# Patient Record
Sex: Male | Born: 1950 | Race: White | Hispanic: No | Marital: Married | Smoking: Former smoker
Health system: Southern US, Community
[De-identification: ages and names within clinical notes are randomized; demographics above are authoritative.]

## PROBLEM LIST (undated history)

## (undated) DIAGNOSIS — I10 Essential (primary) hypertension: Secondary | ICD-10-CM

## (undated) DIAGNOSIS — I493 Ventricular premature depolarization: Secondary | ICD-10-CM

## (undated) DIAGNOSIS — G4730 Sleep apnea, unspecified: Secondary | ICD-10-CM

## (undated) DIAGNOSIS — E785 Hyperlipidemia, unspecified: Secondary | ICD-10-CM

## (undated) DIAGNOSIS — I4891 Unspecified atrial fibrillation: Secondary | ICD-10-CM

## (undated) HISTORY — PX: ATRIAL ABLATION SURGERY: SHX560

## (undated) HISTORY — DX: Sleep apnea, unspecified: G47.30

## (undated) HISTORY — DX: Unspecified atrial fibrillation: I48.91

## (undated) HISTORY — DX: Hyperlipidemia, unspecified: E78.5

## (undated) HISTORY — PX: TONSILLECTOMY AND ADENOIDECTOMY: SUR1326

## (undated) HISTORY — DX: Ventricular premature depolarization: I49.3

## (undated) HISTORY — DX: Essential (primary) hypertension: I10

## (undated) HISTORY — PX: FLEXIBLE SIGMOIDOSCOPY: SHX1649

---

## 2004-05-17 ENCOUNTER — Inpatient Hospital Stay (HOSPITAL_COMMUNITY): Admission: EM | Admit: 2004-05-17 | Discharge: 2004-05-20 | Payer: Self-pay | Admitting: Emergency Medicine

## 2004-05-18 ENCOUNTER — Encounter (INDEPENDENT_AMBULATORY_CARE_PROVIDER_SITE_OTHER): Payer: Self-pay | Admitting: *Deleted

## 2004-10-12 ENCOUNTER — Ambulatory Visit: Payer: Self-pay | Admitting: Internal Medicine

## 2005-03-08 ENCOUNTER — Ambulatory Visit: Payer: Self-pay | Admitting: Internal Medicine

## 2005-06-27 ENCOUNTER — Ambulatory Visit: Payer: Self-pay | Admitting: Internal Medicine

## 2005-07-29 ENCOUNTER — Ambulatory Visit: Payer: Self-pay | Admitting: Family Medicine

## 2005-11-21 ENCOUNTER — Ambulatory Visit: Payer: Self-pay | Admitting: Internal Medicine

## 2006-09-04 ENCOUNTER — Encounter: Payer: Self-pay | Admitting: Internal Medicine

## 2006-09-21 ENCOUNTER — Encounter: Payer: Self-pay | Admitting: Internal Medicine

## 2006-12-01 ENCOUNTER — Telehealth (INDEPENDENT_AMBULATORY_CARE_PROVIDER_SITE_OTHER): Payer: Self-pay | Admitting: *Deleted

## 2006-12-01 ENCOUNTER — Emergency Department (HOSPITAL_COMMUNITY): Admission: EM | Admit: 2006-12-01 | Discharge: 2006-12-01 | Payer: Self-pay | Admitting: Emergency Medicine

## 2007-04-10 ENCOUNTER — Ambulatory Visit: Payer: Self-pay | Admitting: Cardiology

## 2007-04-11 ENCOUNTER — Inpatient Hospital Stay (HOSPITAL_COMMUNITY): Admission: EM | Admit: 2007-04-11 | Discharge: 2007-04-13 | Payer: Self-pay | Admitting: Emergency Medicine

## 2007-04-12 ENCOUNTER — Encounter (INDEPENDENT_AMBULATORY_CARE_PROVIDER_SITE_OTHER): Payer: Self-pay | Admitting: Cardiology

## 2007-04-17 ENCOUNTER — Telehealth (INDEPENDENT_AMBULATORY_CARE_PROVIDER_SITE_OTHER): Payer: Self-pay | Admitting: *Deleted

## 2007-05-02 ENCOUNTER — Ambulatory Visit: Payer: Self-pay | Admitting: Internal Medicine

## 2007-05-02 DIAGNOSIS — M545 Low back pain: Secondary | ICD-10-CM

## 2007-05-02 DIAGNOSIS — I4891 Unspecified atrial fibrillation: Secondary | ICD-10-CM | POA: Insufficient documentation

## 2007-05-02 DIAGNOSIS — I4892 Unspecified atrial flutter: Secondary | ICD-10-CM

## 2007-05-02 DIAGNOSIS — E785 Hyperlipidemia, unspecified: Secondary | ICD-10-CM

## 2007-05-02 DIAGNOSIS — R209 Unspecified disturbances of skin sensation: Secondary | ICD-10-CM

## 2007-05-11 ENCOUNTER — Encounter (INDEPENDENT_AMBULATORY_CARE_PROVIDER_SITE_OTHER): Payer: Self-pay | Admitting: *Deleted

## 2007-07-02 ENCOUNTER — Encounter: Payer: Self-pay | Admitting: Internal Medicine

## 2007-08-13 ENCOUNTER — Encounter: Payer: Self-pay | Admitting: Internal Medicine

## 2007-09-10 ENCOUNTER — Encounter: Payer: Self-pay | Admitting: Internal Medicine

## 2007-09-17 ENCOUNTER — Ambulatory Visit: Payer: Self-pay | Admitting: Internal Medicine

## 2007-09-17 DIAGNOSIS — N400 Enlarged prostate without lower urinary tract symptoms: Secondary | ICD-10-CM | POA: Insufficient documentation

## 2007-09-17 DIAGNOSIS — G5600 Carpal tunnel syndrome, unspecified upper limb: Secondary | ICD-10-CM

## 2007-09-17 DIAGNOSIS — L299 Pruritus, unspecified: Secondary | ICD-10-CM | POA: Insufficient documentation

## 2007-09-18 ENCOUNTER — Encounter (INDEPENDENT_AMBULATORY_CARE_PROVIDER_SITE_OTHER): Payer: Self-pay | Admitting: *Deleted

## 2007-09-24 ENCOUNTER — Ambulatory Visit: Payer: Self-pay | Admitting: Cardiology

## 2007-10-01 ENCOUNTER — Telehealth (INDEPENDENT_AMBULATORY_CARE_PROVIDER_SITE_OTHER): Payer: Self-pay | Admitting: *Deleted

## 2008-02-13 ENCOUNTER — Ambulatory Visit: Payer: Self-pay | Admitting: Internal Medicine

## 2008-03-05 ENCOUNTER — Inpatient Hospital Stay (HOSPITAL_COMMUNITY): Admission: EM | Admit: 2008-03-05 | Discharge: 2008-03-08 | Payer: Self-pay | Admitting: Emergency Medicine

## 2008-03-05 ENCOUNTER — Ambulatory Visit: Payer: Self-pay | Admitting: Internal Medicine

## 2008-03-14 ENCOUNTER — Ambulatory Visit: Payer: Self-pay | Admitting: Internal Medicine

## 2008-09-18 ENCOUNTER — Telehealth: Payer: Self-pay | Admitting: Family Medicine

## 2009-01-29 ENCOUNTER — Encounter (INDEPENDENT_AMBULATORY_CARE_PROVIDER_SITE_OTHER): Payer: Self-pay | Admitting: *Deleted

## 2009-02-23 IMAGING — CR DG CHEST 1V PORT
1 series · 1 of 1 positions shown · non-contrast
Comparison: 12/01/06.

CLINICAL DATA: Shortness of breath.  Cardiac arrhythmia.  
 PORTABLE CHEST:

[AP]
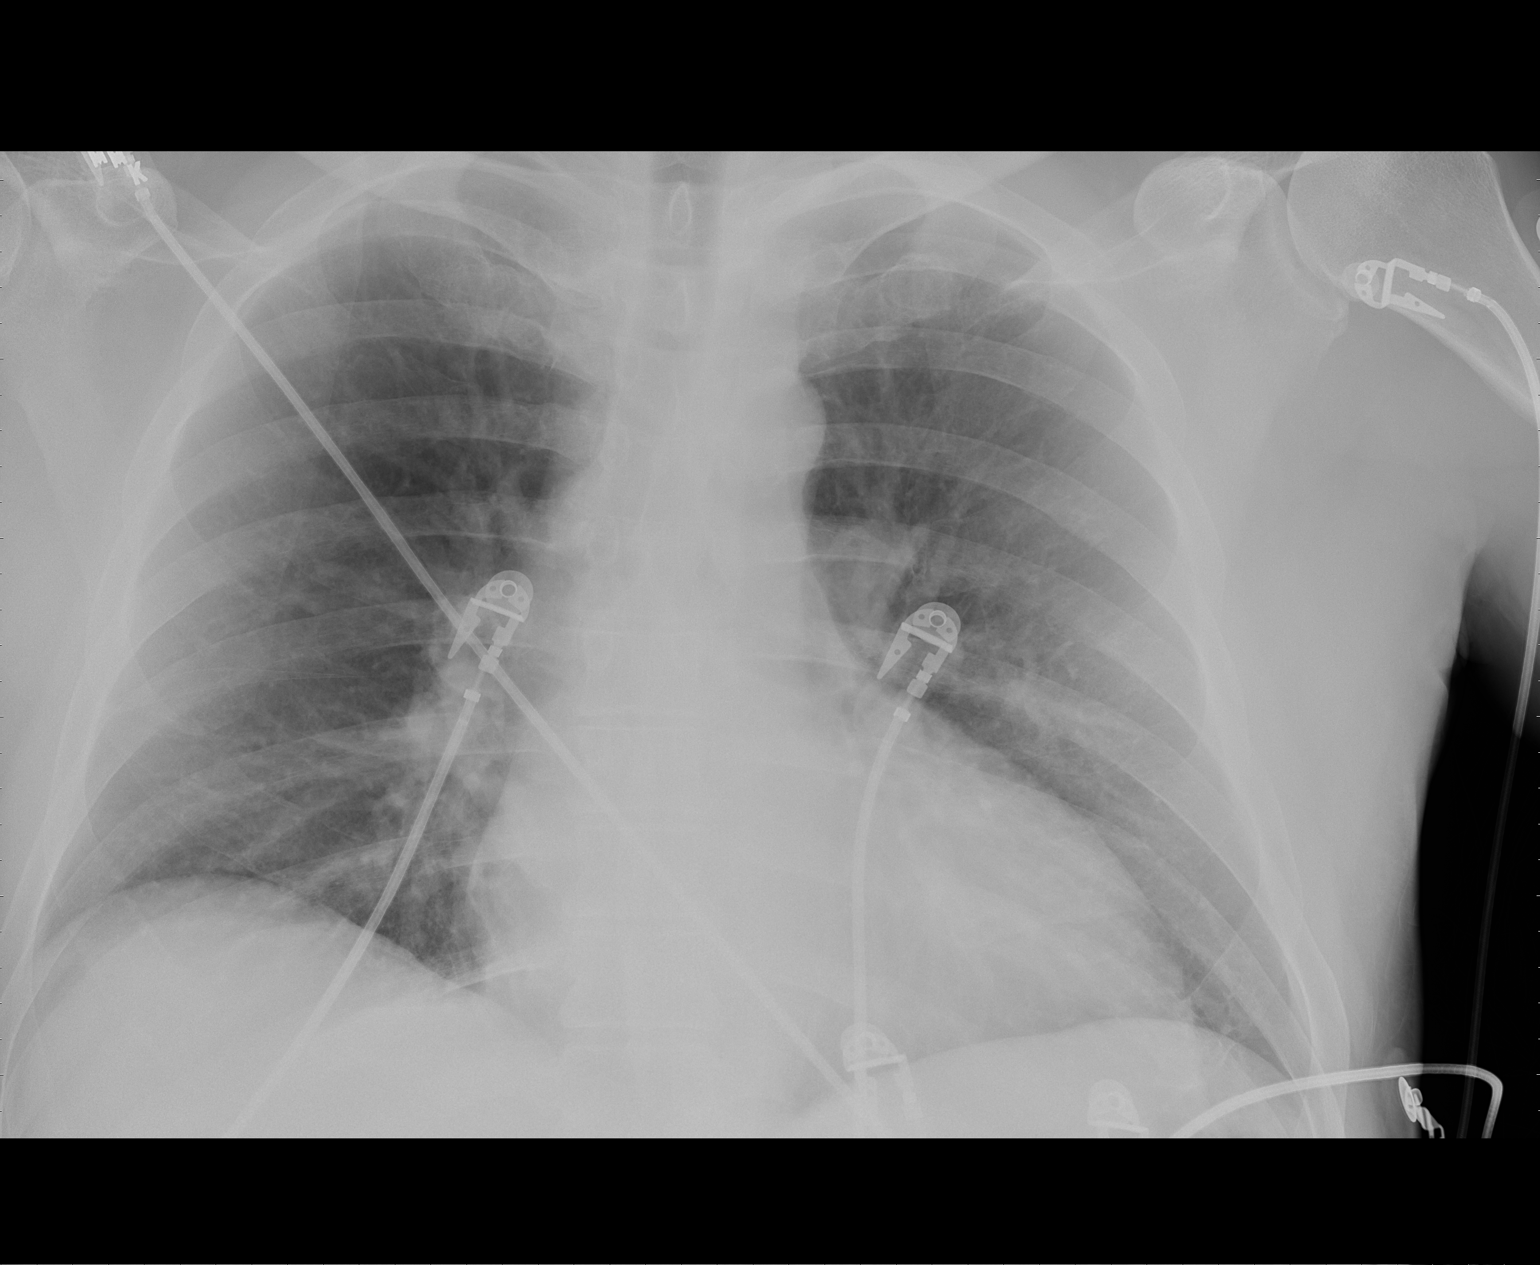

[1 of 1 positions shown; findings below may reference images not displayed]

FINDINGS: Low lung volumes are seen.  Heart size is probably within normal limits allowing for low lung volumes.  Both lungs are clear.
IMPRESSION: Low inspiratory lung volumes.  No acute findings.

## 2009-03-06 ENCOUNTER — Ambulatory Visit: Payer: Self-pay | Admitting: Family Medicine

## 2009-04-09 ENCOUNTER — Ambulatory Visit: Payer: Self-pay | Admitting: Internal Medicine

## 2009-04-09 DIAGNOSIS — I10 Essential (primary) hypertension: Secondary | ICD-10-CM

## 2009-11-23 ENCOUNTER — Telehealth (INDEPENDENT_AMBULATORY_CARE_PROVIDER_SITE_OTHER): Payer: Self-pay | Admitting: *Deleted

## 2010-01-19 IMAGING — CR DG CHEST 1V PORT
1 series · 1 of 1 positions shown · non-contrast
Comparison: Portable exam 5201 hours compared to 04/10/2007

CLINICAL DATA: Arrhythmia, chest pain, shortness of breath

PORTABLE CHEST - 1 VIEW

[view not recorded]
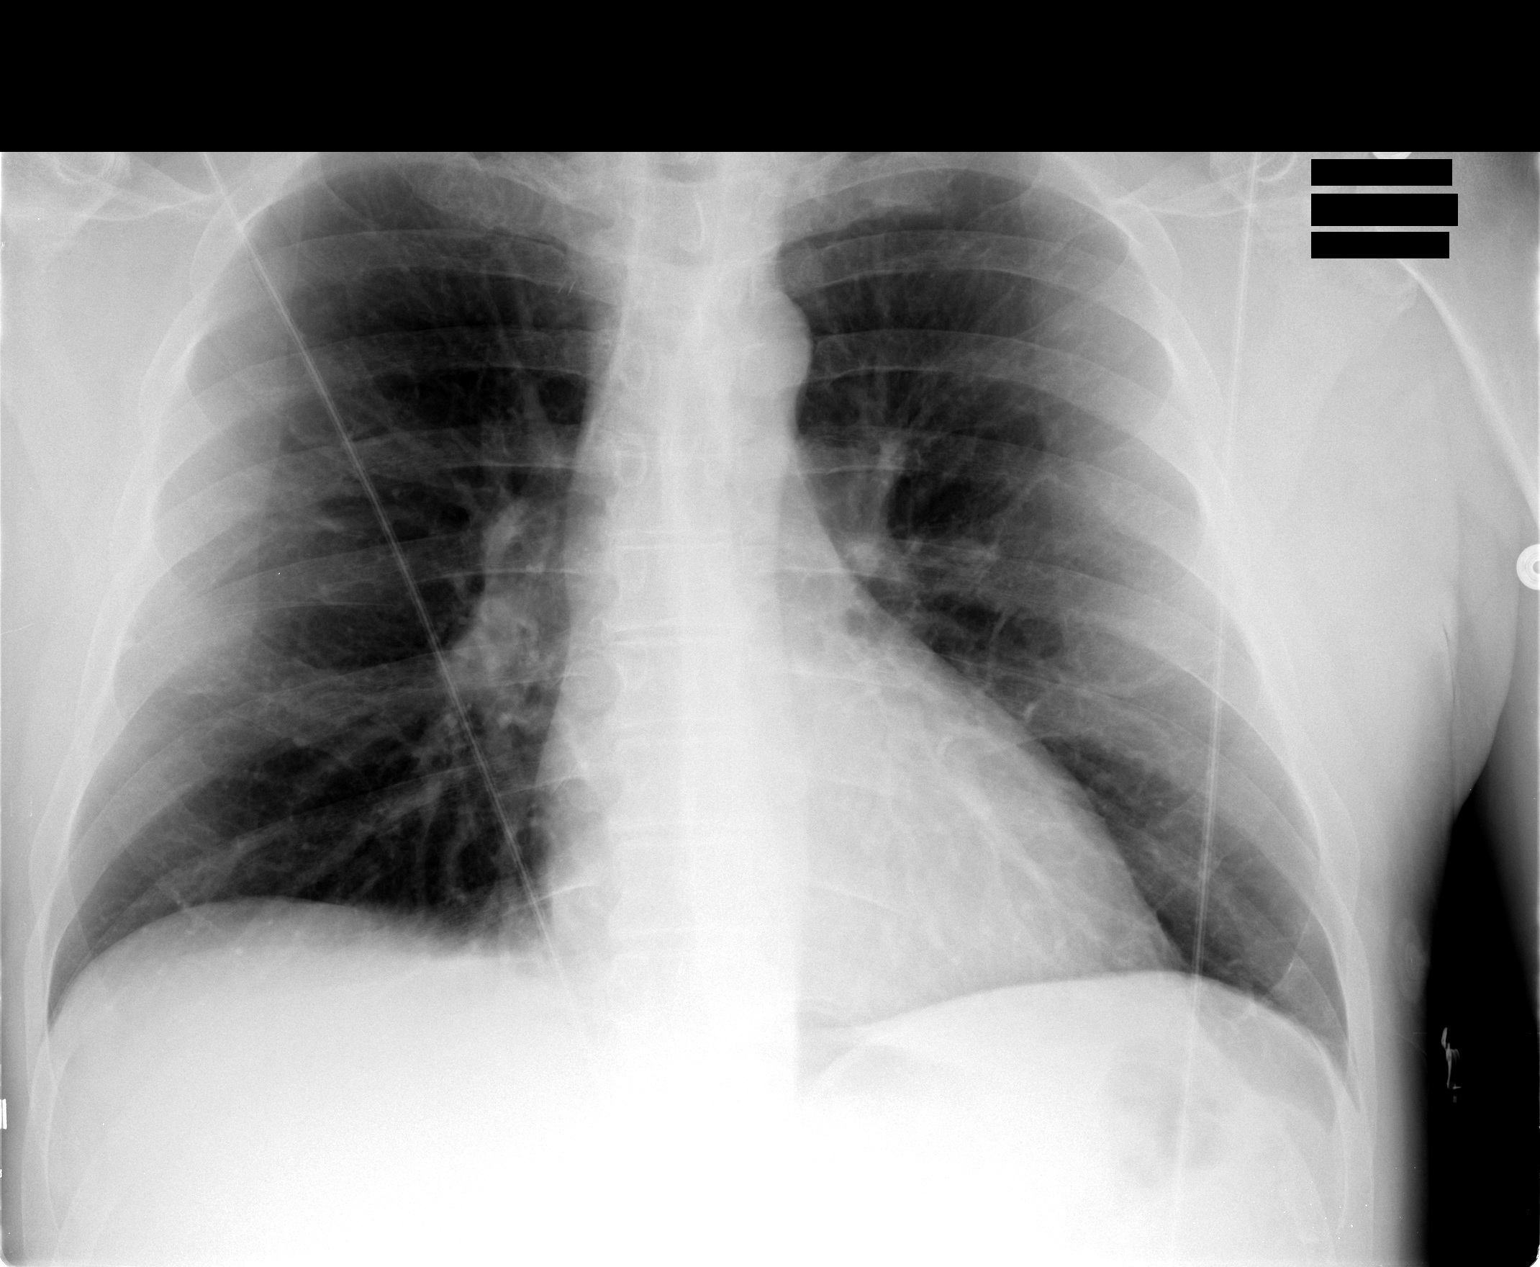

[1 of 1 positions shown; findings below may reference images not displayed]

FINDINGS: Normal heart size, mediastinal contours, and pulmonary vascularity.
Lungs clear.
No pleural effusion or pneumothorax.
IMPRESSION: No acute abnormalities.

## 2010-02-03 ENCOUNTER — Encounter: Payer: Self-pay | Admitting: Internal Medicine

## 2010-02-03 ENCOUNTER — Ambulatory Visit: Payer: Self-pay | Admitting: Internal Medicine

## 2010-02-08 LAB — CONVERTED CEMR LAB
ALT: 38 units/L (ref 0–53)
AST: 24 units/L (ref 0–37)
Basophils Relative: 1.1 % (ref 0.0–3.0)
Eosinophils Relative: 3.4 % (ref 0.0–5.0)
GFR calc non Af Amer: 116.14 mL/min (ref >60)
HCT: 46.8 % (ref 39.0–52.0)
HDL: 56 mg/dL (ref >39.00)
Hemoglobin: 16.1 g/dL (ref 13.0–17.0)
Lymphs Abs: 1.8 10*3/uL (ref 0.7–4.0)
Monocytes Relative: 6.7 % (ref 3.0–12.0)
Neutro Abs: 4.4 10*3/uL (ref 1.4–7.7)
Platelets: 247 10*3/uL (ref 150.0–400.0)
Potassium: 4.5 meq/L (ref 3.5–5.1)
RBC: 5.19 M/uL (ref 4.22–5.81)
Sodium: 143 meq/L (ref 135–145)
TSH: 0.76 microintl units/mL (ref 0.35–5.50)
Total Bilirubin: 0.8 mg/dL (ref 0.3–1.2)
Total CHOL/HDL Ratio: 4
Total Protein: 6.9 g/dL (ref 6.0–8.3)
VLDL: 36.2 mg/dL (ref 0.0–40.0)
WBC: 7 10*3/uL (ref 4.5–10.5)

## 2010-04-25 LAB — CONVERTED CEMR LAB
ALT: 48 units/L (ref 0–53)
AST: 23 units/L (ref 0–37)
Basophils Absolute: 0.1 10*3/uL (ref 0.0–0.1)
Basophils Relative: 1.2 % — ABNORMAL HIGH (ref 0.0–1.0)
Bilirubin, Direct: 0.1 mg/dL (ref 0.0–0.3)
CO2: 30 meq/L (ref 19–32)
Chloride: 106 meq/L (ref 96–112)
Glucose, Bld: 87 mg/dL (ref 70–99)
Hemoglobin: 16.9 g/dL (ref 13.0–17.0)
Lymphocytes Relative: 23.9 % (ref 12.0–46.0)
MCHC: 35.2 g/dL (ref 30.0–36.0)
Monocytes Relative: 6.5 % (ref 3.0–12.0)
Neutrophils Relative %: 66.2 % (ref 43.0–77.0)
RBC: 5.29 M/uL (ref 4.22–5.81)
Sodium: 142 meq/L (ref 135–145)
Total Bilirubin: 0.9 mg/dL (ref 0.3–1.2)
Total Protein: 7.2 g/dL (ref 6.0–8.3)

## 2010-04-27 NOTE — Assessment & Plan Note (Signed)
Summary: cpx - cbs   Vital Signs:  Patient profile:   60 year old male Height:      73.25 inches Weight:      222.6 pounds BMI:     29.27 Temp:     98.2 degrees F oral Pulse rate:   56 / minute Resp:     16 per minute BP sitting:   148 / 86  (left arm) Cuff size:   large  Vitals Entered By: Shonna Chock CMA (February 03, 2010 9:49 AM) CC: CPX with fasting labs , Fatigue   Primary Care Provider:  Alwyn Ren  CC:  CPX with fasting labs  and Fatigue.  History of Present Illness:       Arthur Escobar is here for a physical; he has had fatigue by mid afternoon for almost a year. The patient reports fatigue without physical activity.  The patient denies fever, night sweats, weight loss, exertional chest pain, dyspnea, cough, hemoptysis, and new medications.  Other symptoms include  intermittent severe snoring and daytime sleepiness.  No apnea reported to date. OSA test was" borderline " in 2009. The patient denies the following symptoms: leg swelling, orthopnea, PND, melena, adenopathy, and skin changes.  The patient denies altered appetite and poor sleep.   Hyperlipidemia Follow-Up       The patient denies muscle aches, abdominal pain, flushing, itching, constipation, and diarrhea.  The patient denies the following symptoms: syncope.  Compliance with medications (by patient report) has been near 100%.  Dietary compliance has been excellent.  The patient reports no exercise.  Adjunctive measures currently used by the patient include ASA.    Current Medications (verified): 1)  Flecainide Acetate 100 Mg  Tabs (Flecainide Acetate) .Marland Kitchen.. 1 By Mouth Two Times A Day 2)  Paroxetine Hcl 20 Mg  Tabs (Paroxetine Hcl) .Marland Kitchen.. 1 Once Daily 3)  Crestor 20 Mg Tabs (Rosuvastatin Calcium) .Marland Kitchen.. 1 By Mouth At Bedtime 4)  Bayer Aspirin 325 Mg Tabs (Aspirin) .Marland Kitchen.. 1 By Mouth Once Daily  Allergies (verified): No Known Drug Allergies  Past History:  Past Medical History:  1. Paroxysmal atrial fibrillation , Dr Amil Amen  2.  Isthmus dependent atrial flutter status post cavo-tricuspid isthmus ablation March 07, 2008.   3. 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    4. Obstructive sleep apnea, "borderline"  in 2009.   5. Patent foramen ovale.   6. Hyperlipidemia.   7. Ejection fraction 50-60% by echocardiogram in January 2009.   Past Surgical History: Cardioversion 03/2007 ; Flexible sigmoidoscopy 2004 Tonsillectomy  Family History: Father: HTN,MI in 80s,CBAG Mother: CHF,pacer Siblings: negative  Social History: The patient denies tobacco or ethanol abuse.  Occupation:Project Manager Married  Review of Systems       The patient complains of decreased hearing.  The patient denies vision loss, hoarseness, headaches, suspicious skin lesions, depression, unusual weight change, abnormal bleeding, and angioedema.    Physical Exam  General:  well-nourished;alert,appropriate and cooperative throughout examination Head:  Normocephalic and atraumatic without obvious abnormalities. pattern  alopecia  Eyes:  No corneal or conjunctival inflammation noted. EOMI. Perrla. Funduscopic exam benign, without hemorrhages, exudates or papilledema. Vision grossly normal. Ears:  External ear exam shows no significant lesions or deformities.  Otoscopic examination reveals clear canals, tympanic membranes are intact bilaterally without bulging, retraction, inflammation or discharge. Hearing is grossly normal bilaterally. Nose:  External nasal examination shows no deformity or inflammation. Nasal mucosa are pink and moist without lesions or  exudates. Septal dislocation Mouth:  Oral mucosa and oropharynx without lesions or exudates.  Teeth in good repair. Lungs:  Normal respiratory effort, chest expands symmetrically. Lungs are clear to auscultation, no crackles or wheezes. Heart:  regular rhythm, no murmur, no gallop, no rub, no JVD, no HJR, and bradycardia.   Abdomen:  Bowel sounds  positive,abdomen soft and non-tender without masses, organomegaly or hernias noted. Rectal:  No external abnormalities noted. Normal sphincter tone. No rectal masses or tenderness. Genitalia:  Testes bilaterally descended without nodularity, tenderness or masses. No scrotal masses or lesions. No penis lesions or urethral discharge. Prostate:  Prostate  only partially examined; no definite abnormaliities Msk:  No deformity or scoliosis noted of thoracic or lumbar spine.   Pulses:  R and L carotid,radial,dorsalis pedis and posterior tibial pulses are full and equal bilaterally Extremities:  No clubbing, cyanosis, edema, or deformity noted with normal full range of motion of all joints.   Neurologic:  alert & oriented X3 and DTRs symmetrical and normal.   Skin:  Intact without suspicious lesions or rashes. Lipomata of abdomen Cervical Nodes:  No lymphadenopathy noted Axillary Nodes:  No palpable lymphadenopathy Inguinal Nodes:  No significant adenopathy Psych:  memory intact for recent and remote, normally interactive, and good eye contact.     Impression & Recommendations:  Problem # 1:  ROUTINE GENERAL MEDICAL [email or web address]  CARE FACL (ICD-V70.0)  Orders: EKG w/ Interpretation (93000) Venipuncture (44315) TLB-Lipid Panel (80061-LIPID) TLB-BMP (Basic Metabolic Panel-BMET) (80048-METABOL) TLB-CBC Platelet - w/Differential (85025-CBCD) TLB-Hepatic/Liver Function Pnl (80076-HEPATIC) TLB-TSH (Thyroid Stimulating Hormone) (84443-TSH) TLB-T4 (Thyrox), Free (40086-PY1P) TLB-PSA (Prostate Specific Antigen) (84153-PSA)  Problem # 2:  FATIGUE (ICD-780.79) PMH of "borderline" OSA  Problem # 3:  ATRIAL FIBRILLATION (ICD-427.31) PAF , PMH of . EKG: NSR. As per Dr Amil Amen His updated medication list for this problem includes:    Flecainide Acetate 100 Mg Tabs (Flecainide acetate) .Marland Kitchen... 1 by mouth two times a day    Bayer Aspirin 325 Mg Tabs (Aspirin) .Marland Kitchen... 1 by mouth once daily  Problem # 4:   HYPERLIPIDEMIA (ICD-272.4)  His updated medication list for this problem includes:    Crestor 20 Mg Tabs (Rosuvastatin calcium) .Marland Kitchen... 1 by mouth at bedtime  Problem # 5:  SPECIAL SCREENING MALIGNANT NEOPLASM OF PROSTATE (ICD-V76.44)  Complete Medication List: 1)  Flecainide Acetate 100 Mg Tabs (Flecainide acetate) .Marland Kitchen.. 1 by mouth two times a day 2)  Paroxetine Hcl 20 Mg Tabs (Paroxetine hcl) .Marland Kitchen.. 1 once daily 3)  Crestor 20 Mg Tabs (Rosuvastatin calcium) .Marland Kitchen.. 1 by mouth at bedtime 4)  Bayer Aspirin 325 Mg Tabs (Aspirin) .Marland Kitchen.. 1 by mouth once daily  Patient Instructions: 1)  Sleep referral if labs are negative. 2)  Schedule a colonoscopy to help detect colon cancer asdiscussed.   Orders Added: 1)  Est. Patient 40-64 years [99396] 2)  EKG w/ Interpretation [93000] 3)  Venipuncture [36415] 4)  TLB-Lipid Panel [80061-LIPID] 5)  TLB-BMP (Basic Metabolic Panel-BMET) [80048-METABOL] 6)  TLB-CBC Platelet - w/Differential [85025-CBCD] 7)  TLB-Hepatic/Liver Function Pnl [80076-HEPATIC] 8)  TLB-TSH (Thyroid Stimulating Hormone) [84443-TSH] 9)  TLB-T4 (Thyrox), Free [50932-IZ1I] 10)  TLB-PSA (Prostate Specific Antigen) [45809-XIP]     Appended Document: cpx - cbs Flu Vaccine Consent Questions     Do you have a history of severe allergic reactions to this vaccine? no    Any prior history of allergic reactions to egg and/or gelatin? no    Do you have a sensitivity to the preservative Thimersol? no  Do you have a past history of Guillan-Barre Syndrome? no    Do you currently have an acute febrile illness? no    Have you ever had a severe reaction to latex? no    Vaccine information given and explained to patient? yes    Are you currently pregnant? no    Lot Number:[id number]   Exp Date:09/25/2010   Site Given  Right Deltoid IM    Appended Document: cpx - cbs

## 2010-04-27 NOTE — Assessment & Plan Note (Signed)
Summary: COUGHING, TROUBLE CATCHING BREATH, NO FEVER, ACHING, HEADACHE...   Vital Signs:  Patient profile:   60 year old male Weight:      218.4 pounds O2 Sat:      97 % Temp:     98.9 degrees F oral Pulse rate:   75 / minute Resp:     16 per minute BP sitting:   150 / 90  (left arm) Cuff size:   large  Vitals Entered By: Shonna Chock (April 09, 2009 4:44 PM)  Primary Care Provider:  Alwyn Ren   History of Present Illness: Onset 04/06/2009 as joint pain , myalgias & headaches over crown. NP cough as of 01/12.Rx: Theraflu . See BP; he denies decongestant use. No PMH of asthma ; remote smoking hx.See ROS for active symptoms.Last OV for sinusitis reviewed ; some cough syrup left.  Allergies (verified): No Known Drug Allergies  Review of Systems General:  Complains of sweats; denies chills and fever. ENT:  Denies nasal congestion and sinus pressure; No frontal headache, facial pain or purulence. Resp:  Complains of cough; denies chest pain with inspiration, shortness of breath, sputum productive, and wheezing.  Physical Exam  General:  in no acute distress; alert,appropriate and cooperative throughout examination Ears:  External ear exam shows no significant lesions or deformities.  Otoscopic examination reveals clear canals, tympanic membranes are intact bilaterally without bulging, retraction, inflammation or discharge. Hearing is grossly normal bilaterally. Nose:  External nasal examination shows no deformity or inflammation. Nasal mucosa are pink and moist without lesions or exudates. Septum dislocated  Mouth:  Oral mucosa and oropharynx without lesions or exudates.  Teeth in good repair. Lungs:  Normal respiratory effort, chest expands symmetrically. Lungs are clear to auscultation, no crackles or wheezes. Dry cough Cervical Nodes:  No lymphadenopathy noted Axillary Nodes:  No palpable lymphadenopathy   Impression & Recommendations:  Problem # 1:  BRONCHITIS-ACUTE  (ICD-466.0)  The following medications were removed from the medication list:    Cheratussin Ac 100-10 Mg/33ml Syrp (Guaifenesin-codeine) .Marland Kitchen... 1-2 tsp by mouth at bedtime as needed His updated medication list for this problem includes:    Doxycycline Hyclate 100 Mg Caps (Doxycycline hyclate) .Marland Kitchen... 1 two times a day x 5 days then 1 once daily  Problem # 2:  ELEVATED BLOOD PRESSURE WITHOUT DIAGNOSIS OF HYPERTENSION (ICD-796.2)  Complete Medication List: 1)  Flecainide Acetate 100 Mg Tabs (Flecainide acetate) .Marland Kitchen.. 1 by mouth two times a day 2)  Paroxetine Hcl 20 Mg Tabs (Paroxetine hcl) .Marland Kitchen.. 1 once daily 3)  Crestor 10 Mg Tabs (Rosuvastatin calcium) .Marland Kitchen.. 1 by mouth daily 4)  Doxycycline Hyclate 100 Mg Caps (Doxycycline hyclate) .Marland Kitchen.. 1 two times a day x 5 days then 1 once daily  Patient Instructions: 1)  Consume LESS THAN 40 grams of sugar/day from LABELED foods & drinks with High Fructose Corn Syrup as #1,2 or #3 . 2)  Drink as much fluid as you can tolerate for the next few days. 3)  Check your Blood Pressure regularly. If it is above: 135/85 ON AVERAGE  you should make an appointment. Prescriptions: DOXYCYCLINE HYCLATE 100 MG CAPS (DOXYCYCLINE HYCLATE) 1 two times a day X 5 days then 1 once daily  #15 x 0   Entered and Authorized by:   Marga Melnick MD   Signed by:   Marga Melnick MD on 04/09/2009   Method used:   Faxed to ...       [facility]  [address] [phone]* (retail)  [address]       [place], [place]  [address]       Ph: [phone]       Fax: [phone]   RxID:   [id number]

## 2010-04-27 NOTE — Progress Notes (Signed)
Summary: CALLED TO SAY HE DOESNT KNOW CRESTOR MG'S(LMOM 8/30)  Phone Note Call from Patient Call back at [facility] Phone [phone]   Caller: Patient Summary of Call: PATIENT CALLED TO REPORT THAT HE DOES NOT KNOW WHETHER HE TAKES CRESTOR 10 MG OR CRESTOR 20 MG (HE JUSTS OPENS THE BOTTLE AND TAKES THE PILL---DOESNT KNOW STRENGTH)  NEEDS REFILL   Initial call taken by: Jerolyn Shin,  November 23, 2009 11:47 AM  Follow-up for Phone Call        I called patient and left a voice mail informing him that I need to know the dose of his med to refill, I informed patient if he would look at his med bottle and clarify what the dose is. Follow-up by: Shonna Chock CMA,  November 23, 2009 12:05 PM  Additional Follow-up for Phone Call Additional follow up Details #1::        Left message on machine for patient to return call when avaliable, Reason for call:   Discuss dose of Crestor, stressed the importance of [place] knowing exactly what dose he is taking, requested patient call with this info Additional Follow-up by: Shonna Chock CMA,  November 24, 2009 9:43 AM    Additional Follow-up for Phone Call Additional follow up Details #2::    I spoke with patient and had him look at his bottle to verify Crestor dose, patient taking 20mg  tab once daily Follow-up by: Shonna Chock CMA,  November 24, 2009 4:44 PM  New/Updated Medications: CRESTOR 20 MG TABS (ROSUVASTATIN CALCIUM) 1 by mouth at bedtime Prescriptions: CRESTOR 20 MG TABS (ROSUVASTATIN CALCIUM) 1 by mouth at bedtime  #90 x 0   Entered by:   Shonna Chock CMA   Authorized by:   Marga Melnick MD   Signed by:   Shonna Chock CMA on 11/24/2009   Method used:   Electronically to        [facility] [phone]* (retail)       [address]       [place], [place]  [address]       Ph: [phone]       Fax: [phone]   RxID:   [id number]

## 2010-05-19 ENCOUNTER — Encounter: Payer: Self-pay | Admitting: Internal Medicine

## 2010-05-19 ENCOUNTER — Ambulatory Visit (INDEPENDENT_AMBULATORY_CARE_PROVIDER_SITE_OTHER): Payer: BC Managed Care – PPO | Admitting: Internal Medicine

## 2010-05-19 ENCOUNTER — Other Ambulatory Visit: Payer: Self-pay | Admitting: Internal Medicine

## 2010-05-19 DIAGNOSIS — R5381 Other malaise: Secondary | ICD-10-CM

## 2010-05-19 DIAGNOSIS — F411 Generalized anxiety disorder: Secondary | ICD-10-CM | POA: Insufficient documentation

## 2010-05-19 DIAGNOSIS — R5383 Other fatigue: Secondary | ICD-10-CM | POA: Insufficient documentation

## 2010-05-19 DIAGNOSIS — I4892 Unspecified atrial flutter: Secondary | ICD-10-CM

## 2010-05-19 LAB — CBC WITH DIFFERENTIAL/PLATELET
Basophils Relative: 1.1 % (ref 0.0–3.0)
Eosinophils Relative: 3.3 % (ref 0.0–5.0)
HCT: 47.4 % (ref 39.0–52.0)
Lymphs Abs: 2.1 10*3/uL (ref 0.7–4.0)
MCV: 90.3 fl (ref 78.0–100.0)
Monocytes Absolute: 0.5 10*3/uL (ref 0.1–1.0)
Platelets: 259 10*3/uL (ref 150.0–400.0)
WBC: 8.4 10*3/uL (ref 4.5–10.5)

## 2010-05-19 LAB — POTASSIUM: Potassium: 4.3 mEq/L (ref 3.5–5.1)

## 2010-05-19 LAB — BUN: BUN: 12 mg/dL (ref 6–23)

## 2010-05-19 LAB — TSH: TSH: 0.8 u[IU]/mL (ref 0.35–5.50)

## 2010-05-25 NOTE — Assessment & Plan Note (Signed)
Summary: to go over a script for paxil--ph   Vital Signs:  Patient profile:   60 year old male Weight:      224 pounds BMI:     29.46 Temp:     98.3 degrees F oral Pulse rate:   76 / minute Resp:     14 per minute BP sitting:   130 / 86  (left arm) Cuff size:   large  Vitals Entered By: Shonna Chock CMA (May 19, 2010 1:47 PM) CC: Discuss Paxil, Fatigue   Primary Care Provider:  Alwyn Ren  CC:  Discuss Paxil and Fatigue.  History of Present Illness:    Arthur Escobar feels Paroxetine has been of value , but on 05/17/2010 he had a "panic attack". The night before he had driven back from [facility] in snow & sleet. Also his energy level has decreased recently ,over several months.He reports persistent fatigue and primarily motivational fatigue.  The patient denies fever, night sweats, weight loss, exertional chest pain, dyspnea, cough, and hemoptysis.  Other symptoms include severe snoring and daytime sleepiness. A Sleep Study was borderline several years ago. He was intolerant to CPAP. The patient denies the following symptoms: leg swelling, orthopnea, PND, melena, adenopathy, and skin changes.    Current Medications (verified): 1)  Flecainide Acetate 100 Mg  Tabs (Flecainide Acetate) .Marland Kitchen.. 1 By Mouth Two Times A Day 2)  Paroxetine Hcl 20 Mg  Tabs (Paroxetine Hcl) .Marland Kitchen.. 1 Once Daily 3)  Crestor 20 Mg Tabs (Rosuvastatin Calcium) .Marland Kitchen.. 1 By Mouth At Bedtime 4)  Bayer Aspirin 325 Mg Tabs (Aspirin) .Marland Kitchen.. 1 By Mouth Once Daily  Allergies (verified): No Known Drug Allergies  Physical Exam  General:  well-nourished,in no acute distress; alert,appropriate and cooperative throughout examination Eyes:  No corneal or conjunctival inflammation noted. EOMI. Perrla.No definite lid lag Mouth:  Oral mucosa and oropharynx without lesions or exudates.  Teeth in good repair. Oropharynx not crowded Neck:  No deformities, masses, or tenderness noted. Lungs:  Normal respiratory effort, chest expands symmetrically.  Lungs are clear to auscultation, no crackles or wheezes. Heart:  normal rate, regular rhythm, no gallop, no rub, no JVD, no HJR, and grade 1 /6 systolic murmur.   Abdomen:  Bowel sounds positive,abdomen soft and non-tender without masses, organomegaly or hernias noted. Pulses:  R and L carotid,radial,dorsalis pedis and posterior tibial pulses are full and equal bilaterally Extremities:  No clubbing, cyanosis. Trace edema @ sock line.Minimal OA DIP  deformity noted .No onycholysis Neurologic:  alert & oriented X3 and DTRs symmetrical and normal.   No tremor Skin:  Intact without suspicious lesions or rashes Cervical Nodes:  No lymphadenopathy noted Axillary Nodes:  No palpable lymphadenopathy Psych:  memory intact for recent and remote, normally interactive, good eye contact, not anxious appearing, and not depressed appearing.     Impression & Recommendations:  Problem # 1:  FATIGUE (ICD-780.79)  Orders: Venipuncture ([address]) TLB-CBC Platelet - w/Differential (85025-CBCD) TLB-TSH (Thyroid Stimulating Hormone) (84443-TSH) TLB-ALT (SGPT) (84460-ALT) TLB-AST (SGOT) (84450-SGOT) TLB-Creatinine, Blood (82565-CREA) TLB-Potassium (K+) (84132-K) TLB-BUN (Urea Nitrogen) (84520-BUN)  Problem # 2:  ANXIETY (ICD-300.00)  His updated medication list for this problem includes:    Paroxetine Hcl 20 Mg Tabs (Paroxetine hcl) .Marland Kitchen... 1 & 1/2  once daily  Problem # 3:  ATRIAL FLUTTER (ICD-427.32) S/P ablation; RR now on Flecanide His updated medication list for this problem includes:    Flecainide Acetate 100 Mg Tabs (Flecainide acetate) .Marland Kitchen... 1 by mouth two times a day  Bayer Aspirin 325 Mg Tabs (Aspirin) .Marland Kitchen... 1 by mouth once daily  Complete Medication List: 1)  Flecainide Acetate 100 Mg Tabs (Flecainide acetate) .Marland Kitchen.. 1 by mouth two times a day 2)  Paroxetine Hcl 20 Mg Tabs (Paroxetine hcl) .Marland Kitchen.. 1 & 1/2  once daily 3)  Crestor 20 Mg Tabs (Rosuvastatin calcium) .Marland Kitchen.. 1 by mouth at bedtime 4)   Bayer Aspirin 325 Mg Tabs (Aspirin) .Marland Kitchen.. 1 by mouth once daily  Patient Instructions: 1)  Increase Paroxetine 20 mg to 1&  1/2 once daily . Prescriptions: PAROXETINE HCL 20 MG  TABS (PAROXETINE HCL) 1 & 1/2  once daily  #90 x 1   Entered and Authorized by:   Marga Melnick MD   Signed by:   Marga Melnick MD on 05/19/2010   Method used:   Print then Give to Patient   RxID:   [id number]    Orders Added: 1)  Est. Patient Level IV [46962] 2)  Venipuncture [95284] 3)  TLB-CBC Platelet - w/Differential [85025-CBCD] 4)  TLB-TSH (Thyroid Stimulating Hormone) [84443-TSH] 5)  TLB-ALT (SGPT) [84460-ALT] 6)  TLB-AST (SGOT) [84450-SGOT] 7)  TLB-Creatinine, Blood [82565-CREA] 8)  TLB-Potassium (K+) [84132-K] 9)  TLB-BUN (Urea Nitrogen) [84520-BUN]  Appended Document: to go over a script for paxil--ph No associated headache, flushing , chest pain or diarrhea to suggest Pheochromocytoma.  Appended Document: to go over a script for paxil--ph

## 2010-07-01 ENCOUNTER — Other Ambulatory Visit: Payer: Self-pay | Admitting: Internal Medicine

## 2010-07-01 NOTE — Telephone Encounter (Signed)
Repeat fasting NMR Lipoprofile (272.4)after 4 months, COPIED FROM 01/2010

## 2010-07-26 ENCOUNTER — Other Ambulatory Visit: Payer: Self-pay | Admitting: Internal Medicine

## 2010-07-26 NOTE — Telephone Encounter (Signed)
Repeat fasting NMR Lipoprofile (272.4)after 4 months of dietary changes & see me 7-10 days later to discuss long term risks. Hopp  Copied from 01/2010 lab append

## 2010-08-10 NOTE — Consult Note (Signed)
[name], Escobar NO.:  [id number]   MEDICAL RECORD NO.:  [id number]          PATIENT TYPE:  INP   LOCATION:  3313                         FACILITY:  [facility]   PHYSICIAN:  Doylene Canning. 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   HISTORY OF PRESENT ILLNESS:  The patient is a very pleasant middle-aged  man with a history of palpitations in the past who had a remote history  of atrial fibrillation and has been on flecainide.  He developed atrial  flutter, which has been fairly well controlled on flecainide therapy.  The patient presented to the hospital with atrial flutter and rapid  ventricular response and was placed on intravenous calcium channel  blockers, beta-blockers, digoxin in conjunction with his flecainide  therapy.  The patient converted back to sinus rhythm with a 10-second  pause.  Initial thoughts were that pacemaker insertion would be  indicated.  However, this would not do anything to prevent him from  having atrial flutter or fibrillation.  He is now referred for  consideration for catheter ablation.  The patient has had no syncope.  He does have occasional palpitations.   PAST MEDICAL HISTORY:  Notable for sleep apnea.  He has a history of  dyslipidemia.  He has borderline hypertension.   SOCIAL HISTORY:  The patient denies tobacco or ethanol abuse.   FAMILY HISTORY:  Noncontributory.   REVIEW OF SYSTEMS:  All 10-point review of systems were reviewed.  He  has palpitations as previously noted.  Otherwise, all systems reviewed  are negative except for the palpitations and very mild dyspnea when his  heart goes out of rhythm.   PHYSICAL EXAMINATION:  GENERAL:  He is a pleasant well-appearing middle-  aged man in no distress.  VITAL SIGNS:  The  blood pressure today was 124/70, the pulse was 80 and  regular, the respirations were 18.  HEENT:  Normocephalic and atraumatic.  Pupils are equal and round.  The  oropharynx is moist.  Sclerae anicteric.  He was wearing glasses.  NECK:  No jugular venous distention.  There is no thyromegaly.  Trachea  is midline.  The carotids are 2+ and symmetric.  LUNGS:  Clear to bilaterally auscultation.  There are minimal rales in  the bases.  No wheezes or rhonchi present.  No increased work of  breathing.  CARDIAC:  Regular rate and rhythm.  Normal S1 and S2.  No murmurs, rubs,  or gallops are present.  ABDOMEN:  Soft, nontender.  There is no organomegaly.  The bowel sounds  are present.  No rebound or guarding noted.  EXTREMITIES:  Demonstrated no cyanosis, clubbing, or edema.  The pulses  are 2+ and symmetric.  NEUROLOGIC:  Alert and oriented x3 with cranial nerves intact.  Strength  is 5/5 in all limbs and symmetric.  SKIN:  Normal.  JOINTS:  Normal.  LABORATORY DATA:  The EKG demonstrates sinus rhythm, prior EKGs  demonstrate typical atrial flutter.  Telemetry strips demonstrate a long  pause coming out of flutter into sinus rhythm.   IMPRESSION:  1. Atrial flutter with a rapid ventricular response.  2. History of atrial fibrillation controlled on flecainide.  3. Symptomatic bradycardia with a long post-termination pause from      atrial flutter.   DISCUSSION:  The treatment options have been discussed with the patient  in detail.  One option would be to place a pacemaker alone and continue  antiarrhythmic drug therapy.  Second option would be consider catheter  ablation of the atrial flutter as his atrial fibrillation has been well  controlled up until now.  He would of course continue on flecainide if  we want the ablation around.  This would, however, prevent him from  ending up with a pacemaker.  I discussed the risks, benefits, goals, and  expectations of all the above and he  would like to proceed with catheter  ablation.  This was scheduled earliest possible convenient time.      Doylene Canning. Ladona Ridgel, MD  Electronically Signed     GWT/MEDQ  D:  03/07/2008  T:  03/07/2008  Job:  [id number]

## 2010-08-10 NOTE — Op Note (Signed)
NAMEDEJON, LUKAS NO.:  [id number]   MEDICAL RECORD NO.:  [id number]          PATIENT TYPE:  INP   LOCATION:  3313                         FACILITY:  [facility]   PHYSICIAN:  Hillis Range, MD       DATE OF BIRTH:  [birth year]   DATE OF PROCEDURE:  03/07/2008  DATE OF DISCHARGE:                               OPERATIVE REPORT   SURGEON:  Hillis Range, MD   PREPROCEDURE DIAGNOSIS:  Typical right atrial flutter.   POSTPROCEDURE DIAGNOSIS:  Typical right atrial flutter.   PROCEDURES:  1. Comprehensive electrophysiologic study.  2. Coronary sinus pacing and recording.  3. Mapping of supraventricular tachycardia.  4. Radiofrequency ablation of supraventricular tachycardia.   DESCRIPTION OF PROCEDURE:  Informed written consent was obtained, and  the patient was brought to the electrophysiology lab in a fasting state.  He was adequately sedated with intravenous Versed and fentanyl as  outlined in the nursing report.  The patient's right groin was prepped  and draped in the usual sterile fashion by the EP lab staff.  Using a  percutaneous Seldinger technique, one 6-French, one 7-French, and one 8-  [place] hemostasis sheaths were placed in the right common femoral vein.  A 6-French decapolar Polaris X catheter was introduced through the right  common femoral vein and advanced into the coronary sinus for recording  and pacing from this location.  A 6-French quadripolar Josephson  catheter was introduced through the right common femoral vein and  advanced into the right ventricle for recording and pacing.  This  catheter was then pulled back to the His bundle location.  The patient  presented to the electrophysiology lab in normal sinus rhythm.  His AH  interval measured 128 msec with an HV interval of 50 msec.  The PR  duration was 213 msec with a QRS width of 119 msec, and a QT interval  421 msec.  Ventricular pacing was performed, which revealed midline  concentric  decremental VA conduction with a VA Wenckebach cycle length  of 540 msec.  No tachycardias were induced.  The patient is known to  have classic isthmus dependent right atrial flutter by clinical EKG.  A  7-French [employer] II XP 8-mm ablation catheter was  therefore introduced through the right common femoral vein and  positioned along the usual cavotricuspid isthmus.  A series of  radiofrequency applications were delivered between the tricuspid valve  annulus and the inferior vena cava along the usual flutter isthmus.  Pacing was performed from the distal coronary sinus during ablation.  Following ablation, complete bidirectional isthmus block was achieved as  demonstrated by differential atrial pacing from the low lateral right  atrium.  When pacing from the coronary sinus, the ablation catheter was  positioned across the isthmus lesion and the stimulus to earliest atrial  activation measured 208 msec.  Pacing was then performed from the distal  ablation catheter and the earliest atrial activation recorded from the  coronary sinus measured at 200 msec.  The patient was therefore  concluded to have complete bidirectional isthmus block.  Rapid atrial  pacing was performed, which revealed decremental AV conduction with an  AV Wenckebach cycle length of 420 msec.  PR was noted to be equal to RR;  however, tachycardia was not induced.  Rapid atrial pacing was performed  down to a cycle length of 200 msec with no tachycardias, atrial flutter,  or atrial fibrillation induced.  Atrial extrastimulus testing was  performed, which revealed midline decremental AV conduction with an AH  jump and single echo beats noted.  AV nodal reentrant tachycardia,  however, could not be induced.  As the patient's clinical arrhythmia was  atrial flutter and not AV nodal reentrant tachycardia and given that AV  nodal reentrant tachycardia could not be induced, I elected to not  ablate the slow AV  nodal pathway.  The procedure was, therefore,  considered completed.  All catheters were removed, and the sheaths were  aspirated and flushed.  The sheaths were removed and hemostasis was  assured.  There were no early apparent complications.   CONCLUSIONS:  1. Normal sinus rhythm upon presentation.  2. Dual atrioventricular nodal physiology without inducible      atrioventricular nodal reentrant tachycardia.  3. No evidence of accessory pathways.  4. Successful radiofrequency ablation of the cavotricuspid isthmus      with complete bidirectional isthmus block achieved.  5. No inducible atrial flutter, atrial fibrillation, or      supraventricular tachycardia following ablation.  6. No early apparent complication.      Hillis Range, MD  Electronically Signed     JA/MEDQ  D:  03/07/2008  T:  03/08/2008  Job:  [id number]

## 2010-08-10 NOTE — Assessment & Plan Note (Signed)
[facility]                               LIPID CLINIC NOTE   NAME:Escobar, Arthur                          MRN:          [id number]  DATE:09/24/2007                            DOB:          February 23, 1951    Mr. Arthur Escobar was seen in Lipid Clinic for evaluation of medication  titration associated with his hyperlipidemia.  Mr. Arthur Escobar is a pleasant  gentleman who participates in fair levels of physical activity each  week.  He pays attention to low-fat, low-cholesterol dietary therapies.  He has a history of hyperlipidemia and was referred to the Lipid Clinic  by Dr. Alwyn Ren.  He states that in the past, when taking Crestor, he had  nausea that lasted 3 to 4 days that he tried it.  At that time, he took  it in the morning for breakfast.  He took Zocor years ago and did not  know exactly what that did to him.  He states in dietary review that he  does enjoy sweets though he has cereal for breakfast with fruits,  sandwiches for lunch, and avoids fried red meat as major components of  his diet.  He consumes tea, water, and nonalcoholic beer for his  beverages, though none are excessive.  He relates interest in improving  his overall risk panel.   PAST MEDICAL HISTORY:  Pertinent for atrial flutter, for which he takes  flecainide.   FAMILY HISTORY:  Positive.   CURRENT MEDICATIONS:  Flecainide 100 mg twice daily, warfarin 7.5 mg  daily, and loratadine 10 mg daily as needed for itching.   PHYSICAL EXAMINATION:  Blood pressure today is 136/78, respirations are  15, and heart rate is 68.   Labs that have been forwarded include a normal CBC, normal metabolic  panel, normal LFTs, PSA 0.89, and notes that his [facility] panel has  been sent to [facility].  I have explained to the patient that we may  have to try something that he has taken before.  He will continue to  participate in his level of physical activity and as quickly as I  receive the [facility], I will  follow up with him by telephone.      Shelby Dubin, PharmD, BCPS, CPP  Electronically Signed      Rollene Rotunda, MD, [facility]  Electronically Signed   MP/MedQ  DD: 10/23/2007  DT: 10/24/2007  Job #: [id number]   cc:   Titus Dubin. Alwyn Ren, MD,FACP,FCCP

## 2010-08-10 NOTE — H&P (Signed)
NAMESTAFFORD, RIVIERA NO.:  [id number]   MEDICAL RECORD NO.:  [id number]          PATIENT TYPE:  INP   LOCATION:  4733                         FACILITY:  [facility]   PHYSICIAN:  Vernice Jefferson, MD          DATE OF BIRTH:  [birth year]   DATE OF ADMISSION:  04/10/2007  DATE OF DISCHARGE:                              HISTORY & PHYSICAL   PHYSICIAN:  Rosine Abe, M.D.   CHIEF COMPLAINT:  Palpitations and fatigue.   HISTORY OF PRESENT ILLNESS:  Patient is a 60 year old white male with  relatively no cardiac history, only a history of paroxysmal atrial  fibrillation on Flecainide therapy for the past year.  He has done  relatively well with increasing of fatigue and shortness of breath with  exertion over the past one week.  Patient does report that today he has  noticed an increase in his heart rate and presented to an urgent care  center.  Found to be in atrial flutter with 2:1 conduction and was sent  to [facility] for further evaluation.  Patient reports prior to about a  week ago, he has been in great health and has no complaints.  He has  been taking his flecainide 50 mg as prescribed.   PAST MEDICAL HISTORY:  1. Paroxysmal atrial fibrillation with no history of atrial flutter      before.  2. Hyperlipidemia.   SOCIAL HISTORY:  Married.  Lives in the [place] area.  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  No alcohol.  No drugs.  No tobacco abuse.   FAMILY HISTORY:  Reviewed and noncontributory to patient's current  medical condition.   ALLERGIES:  No known drug allergies.   MEDICATIONS:  1. Flecainide 50 mg b.i.d.  2. Aspirin 81 mg once daily.   REVIEW OF SYSTEMS:  Negative except for otherwise dictated in the above  HPI.   PHYSICAL EXAMINATION:  His blood pressure was 107/77, heart rate 133.  T  max afebrile.  GENERAL:  A well-developed and well-nourished white male in no acute  distress.  HEENT:  Moist mucous membranes.  No scleral icterus.  No conjunctival  pallor.  NECK:  Supple.  Full range of motion.  No jugular venous distention.  CARDIOVASCULAR:  Regular rate and rhythm.  There are no murmurs, rubs or  gallops.  CHEST:  Clear to auscultation bilaterally.  No rales, wheezes, or  rhonchi.  ABDOMEN:  Soft, nontender, nondistended.  Normoactive bowel sounds.  EXTREMITIES:  No peripheral edema.  Pulses are 2+ bilaterally.  NEURO:  Nonfocal.   MEDICAL DECISION MAKING:  Chest x-ray, no acute infiltrates.   EKG with an atrial flutter of 2:1 conduction, left anterior fascicular  block and nonspecific ST-T wave abnormality.   Laboratory data is significant for a white count of 12 and hemoglobin of  15.2, platelets 257.  Chem-7 within normal limits.  Cardiac biomarkers  are negative.   IMPRESSION:  1. Atrial flutter with 2:1 conduction versus an atrial tachycardia of      2:1 conduction.  I suspect, given  he is on flecainide therapy, is      likely to slow-to-splutter circuit.  2. History of paroxysmal atrial fibrillation.  3. Hyperlipidemia.   PLAN:  Given his history of paroxysmal atrial fib and he is already on  flecainide therapy, we will give him an increased dose of flecainide  this evening as well as initiate beta blockade therapy.  We will  initiate heparin tonight for a possible TEE, and if needed,  cardioversion in the morning.  We will also increase his dose of  flecainide, and in addition give him more metoprolol.      Vernice Jefferson, MD  Electronically Signed     JT/MEDQ  D:  04/11/2007  T:  04/11/2007  Job:  [id number]

## 2010-08-10 NOTE — Discharge Summary (Signed)
NAMEJERRION, Arthur Escobar NO.:  [id number]   MEDICAL RECORD NO.:  [id number]          PATIENT TYPE:  INP   LOCATION:  4732                         FACILITY:  [facility]   PHYSICIAN:  Francisca December, M.D.  DATE OF BIRTH:  04/02/1950   DATE OF ADMISSION:  04/10/2007  DATE OF DISCHARGE:  04/13/2007                               DISCHARGE SUMMARY   DISCHARGE DIAGNOSES:  1. Atrial flutter status post cardioversion back into normal sinus      rhythm, on flecainide therapy.  2. Hyperlipidemia.  3. Long-term medication use.   HOSPITAL COURSE:  [name] is a 60 year old male patient with a history  of paroxysmal atrial fibrillation on flecainide therapy.  He did  relatively well until about a week prior to admission where he has had  some shortness of breath and noted increased heart rate.  He presented  to the Urgent Care Center and he was found to be in atrial flutter.  He  was sent to [facility] for evaluation.  On arrival, his flecainide dose  was increased and he was monitored closely.  He ultimately received a  cardioversion and with 150 joules was converted into sinus bradycardia  successfully.   He was seen by Dr. Lewayne Bunting, the electrophysiology specialist.  He  discussed the benefits, risks, goals and expectations of the EP study  and catheter ablation and felt that the patient could go home and he  would follow up with Dr. Ladona Ridgel as an outpatient.  We are discharging  the patient home on Coumadin with Lovenox overlap.   DISCHARGE LABORATORIES:  PT 16.3, INR 1.3.  White count 6.9, hemoglobin  13.2, hematocrit 38.3, platelets 197.  TSH was 0.812.  Hemoglobin A1c  5.1.  Phosphorous level 3.7, magnesium 2.0.  Maximum troponin 0.10 felt  to be secondary to atrial flutter.  Potassium 3.9, BUN 7, creatinine  0.82.  Total cholesterol 188, HDL 39, LDL 138.  Chest x-ray:  No acute  findings.  EKG:  Atrial flutter, rate 106, no acute abnormalities  otherwise.   DISCHARGE MEDICATIONS:  1. Crestor 20 mg 1 p.o. daily for cholesterol.  2. Flecainide 100 mg twice a day.  3. Coumadin 5 mg take 2 tablets on Saturday and Sunday and then he is      to go to the office for a blood level on Monday, April 15, 2006      at 2:45 p.m.  4. Lovenox injections every 12 hours.   He is to see Dr. Lewayne Bunting in two to three weeks.  He is to call for  this appointment.  He is to go to the office for a Coumadin blood level  on April 16, 2007 at 2:45 p.m.  He is to go to the office for an EKG  tracing on April 20, 2007 at 11:30 a.m.  Follow up with Dr.  Deitra Mayo, nurse practitioner on April 27, 2007 at 9:45 a.m.  He is to remain on a low-sodium, heart-healthy diet.  Call for any  questions or concerns.      Larita Fife  Alva Garnet.      Francisca December, M.D.  Electronically Signed    LB/MEDQ  D:  04/13/2007  T:  04/13/2007  Job:  [id number]   cc:   Francisca December, M.D.  Doylene Canning. Ladona Ridgel, MD

## 2010-08-10 NOTE — Letter (Signed)
Escobar 18, 2009    Arthur December, MD  [address]  [place] [address]   RE:  Arthur, Escobar  MRN:  [id number]  /  DOB:  06/27/1950   Arthur Escobar,   It was my pleasure to see your patient Arthur Escobar in electrophysiology  followup today.  As you recall he is a very pleasant 60 year old  gentleman with symptomatic paroxysmal atrial fibrillation and atrial  flutter status post recent cavo-tricuspid isthmus ablation.  He was  admitted to [facility] on Escobar 9, 2009, for atrial  fibrillation and atrial flutter.  He underwent successful cavo-tricuspid  isthmus ablation with complete bidirectional isthmus block achieved.  He  had no inducible arrhythmias following ablation.  He was however noted  to have frequent premature atrial contractions throughout the procedure.  I also demonstrated dual AV nodal physiology during the procedure, but  was unable to induce clinical AV nodal reentrant tachycardia.  I  therefore did not elect to ablate is slow pathway as he has not  clinically had prior AV nodal reentrant tachycardia.  The patient  reports doing well upon discharge, but then presented to your office on  Escobar 15, 2009, with symptomatic atrial fibrillation with rapid  ventricular rates.  He spontaneously converted to sinus rhythm.  He was  initiated on Toprol-XL 50 mg daily at that time and continued on his  home regimen of flecainide with plans to follow up in my office today.  Since that time he reports doing well.  He reports that over the past 2  days he has felt better than I have in a long time.  He denies chest  pain, shortness of breath, nausea, vomiting, palpitations, presyncope,  or syncope.  He reports no difficulties with his groin following the  ablation and is otherwise without complaint today.   PROBLEM LIST:  1. Paroxysmal atrial fibrillation (as above).  2. Isthmus dependent atrial flutter status post cavo-tricuspid isthmus      ablation by  me on Escobar 11, 2009.  3. Dual AV nodal physiology on EP study without inducible AV nodal      reentrant tachycardia.  I therefore did not ablate the slow pathway      on Escobar 11, 2009.  4. Obstructive sleep apnea.  5. Patent foramen ovale.  6. Hyperlipidemia.  7. Ejection fraction 50-60% by echocardiogram in January 2009.   CURRENT MEDICATIONS:  1. Flecainide 100 mg b.i.d.  2. Crestor 20 mg daily.  3. Metoprolol 50 mg daily.  4. Paxil 20 mg daily.  5. Aspirin 325 mg daily.   PHYSICAL EXAMINATION:  VITAL SIGNS:  Blood pressure 110/80, heart rate  52, respirations 18, weight 205 pounds.  GENERAL:  The patient is a well-appearing male in no acute distress.  He  is alert and oriented x3.  HEENT:  Normocephalic, atraumatic.  Sclerae clear.  Conjunctivae pink.  Oropharynx clear.  NECK:  Supple.  No JVD, lymphadenopathy, or bruits.  LUNGS:  Clear to auscultation bilaterally.  HEART:  Regular rate and rhythm.  No murmurs, rubs, or gallops.  GI:  Soft, nontender, nondistended.  Positive bowel sounds.  EXTREMITIES:  No clubbing, cyanosis, or edema.  NEUROLOGIC:  Nonfocal.  SKIN:  No ecchymosis or lacerations.  MUSCULOSKELETAL:  No deformity or atrophy.   EKG reveals sinus bradycardia at 52 beats per minute with an incomplete  right bundle branch block and left axis deviation.   IMPRESSION:  Arthur Escobar is a  pleasant 60 year old gentleman with  paroxysmal atrial fibrillation and recent cavo-tricuspid isthmus  ablation for atrial flutter who presents today for followup.  He has  done well since being seen in your office.  He is found to be in sinus  rhythm today.   PLAN:  Therapeutic strategies for atrial fibrillation including both  medicine and catheter-based therapies were again discussed with the  patient in detail today.  Risks, benefits, and alternatives to EP study  and radiofrequency ablation for atrial fibrillation were also discussed.  Presently, I think that we should  continue the patient on his current  regimen of flecainide 100 mg twice daily.  Should he have increasing  frequency and duration of atrial fibrillation despite this therapy, then  we could proceed directly to catheter ablation or consider a second  antiarrhythmic.  I would favor a trial of dronedarone as a secondary  strategy if he feels flecainide.  The patient would  prefer to continue flecainide at this time and will contact me if he  wishes to proceed with catheter ablation in the future.  I have  encouraged the patient to follow up with your office as previously  scheduled.  Please feel free to contact me if he wishes to proceed with  catheter ablation or if I can be of further assistance down the road.    Sincerely,      Hillis Range, MD  Electronically Signed    JA/MedQ  DD: 03/14/2008  DT: 03/15/2008  Job #: [id number]

## 2010-08-10 NOTE — Op Note (Signed)
NAMESEM, MCCAUGHEY NO.:  [id number]   MEDICAL RECORD NO.:  [id number]          PATIENT TYPE:  INP   LOCATION:  4732                         FACILITY:  [facility]   PHYSICIAN:  Jake Bathe, MD      DATE OF BIRTH:  05/01/1950   DATE OF PROCEDURE:  04/12/2007  DATE OF DISCHARGE:                               OPERATIVE REPORT   PRIMARY CARDIOLOGIST:  Francisca December, M.D.   INDICATIONS:  A 60 year old male with paroxysmal atrial fibrillation/who  was currently in an atrial tachycardia with an atrial rate of  approximately 180, with 2 to 1 conduction who was on flecainide and  symptomatic with this flutter.   PROCEDURE:  Informed consent was obtained and risks of stroke, heart  attack, and death were explained to the patient.  Zoll pads were placed  on the patient and continuous monitoring with pulse oximetry was  obtained.  O2 via nasal cannula was administered as well as through face  mask.  The patient was sedated with a total of 4 mg of Versed and 75 mcg  of fentanyl.  Once he reached steady state of conscious sedation, 150  joules biphasic sink were administered.  Following the cardioversion,  there was approximately 3-4 seconds pause with intermittent escape,  which finally propagated to sinus bradycardia at a rate in the low 50's.  Successful cardioversion.  Strips are on chart.  The patient tolerated  the procedure well without any difficulties.  He was hemodynamically  stable following procedure.  ECG shows sinus rhythm with left atrial  enlargement, left access deviation, and PVC.   IMPRESSION:  Successful DC cardioversion at 150 joules.  The patient  will return to telemetry room.  Continue heparin and Coumadin.      Jake Bathe, MD  Electronically Signed     MCS/MEDQ  D:  04/12/2007  T:  04/13/2007  Job:  [id number]   cc:   Francisca December, M.D.

## 2010-08-13 NOTE — Consult Note (Signed)
NAMEKASHMERE, STAFFA NO.:  [id number]   MEDICAL RECORD NO.:  [id number]          PATIENT TYPE:  INP   LOCATION:  1830                         FACILITY:  [facility]   PHYSICIAN:  Marlan Palau, M.D.  DATE OF BIRTH:  12/20/1950   DATE OF CONSULTATION:  05/17/2004  DATE OF DISCHARGE:                                   CONSULTATION   HISTORY OF PRESENT ILLNESS:  Arthur Escobar is a 60 year old left-handed white  male born [birth year], with a history of an event that began at 3 p.m.  today while at his office.  The patient suddenly noted onset of vertigo that  was associated with some nausea.  The patient was unable to open his eyes  due to severe vertigo.  The patient laid his head down on the desk for a  period of time, but the vertigo seemed to get worse, not better.  The  patient at that point required some assistance getting up out of the chair,  eventually proceeded into an event associated with paralysis of the arms and  legs unassociated with numbness.  The patient never lost consciousness but  did have some vomiting associated with the nausea.  The patient's episode  lasted from around 3 p.m. to about 5:30 p.m. and then gradually improved.  With the resolution of the above event, the patient developed a headache  around the vertex of the head.  The patient is not clear that he ever lost  vision or had double vision.  Wife believes that there was some slurred  speech during the above event.  The patient has never had an event like this  before.  Does note headaches intermittently, may have two or three headaches  a year.  The patient has recently had some events associated with some  difficulty with blurred vision, unable to read normally.  The patient denies  any loss of hearing or ringing in the ears.  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   PAST MEDICAL HISTORY:  1.  History of transient event as above, rule out TIA versus migraine event.  2.  Hypercholesterolemia, on no medications.  3.  Tonsillectomy in the past.   MEDICATIONS:  Aspirin 81 mg a day.   The patient does not smoke, quit 15 years ago.  He does not drink alcohol.   He has no known allergies.   SOCIAL HISTORY:  The patient is married and lives in the [place], [place], area.  He has two children, who are alive and well.  He is a Scientist, research (life sciences).   FAMILY MEDICAL HISTORY:  Notable that mother is alive, age 28, with  congestive heart failure.  Father died of an MI at age 66.  The patient has  one sister, who is alive and well.   REVIEW OF SYSTEMS:  Notable for no recent fevers  or chills.  The patient  does have occasional headaches as above.  He denies neck pain, shortness of  breath, chest pain.  Denies any abdominal pain, nausea or vomiting prior to  today.  Denies any problems controlling the bowels or the bladder.  Has not  had any blackout episodes or seizure events.   PHYSICAL EXAMINATION:  VITAL SIGNS:  Blood pressure is 131/76, heart rate  58, respiratory rate 18, temperature afebrile.  GENERAL:  The patient is a well-developed white male who is alert and  cooperative at the time of the examination.  HEENT:  Head is atraumatic.  Eyes:  Pupils equal, round, and reactive to  light.  Discs are flat bilaterally.  NECK:  Supple, no carotid bruits noted.  RESPIRATORY:  Clear.  CARDIOVASCULAR:  A regular rate and rhythm, no obvious murmurs or rubs  noted.  EXTREMITIES:  Without significant edema.  NEUROLOGIC:  Cranial nerves as above.  Facial symmetry is present.  The  patient has good sensation in the face to pinprick, soft touch bilaterally.  He has good strength of the facial muscles and the muscles of head turning  and shoulder shrug bilaterally.  Speech is  well-enunciated and not aphasic.  Motor testing reveals 5/5 strength in all fours.  Good symmetric motor tone  is noted throughout.  Sensory testing is intact to pinprick, soft touch and  vibratory sensation throughout.  The patient has good finger-nose-finger and  toe-to-finger bilaterally.  The patient was not ambulated.  Deep tendon  reflexes remain symmetric and normal.  Toes downgoing bilaterally.  No drift  is seen.   Head scan is as above.   Laboratory values notable for a white count of 8.2, hemoglobin 15.5,  hematocrit 45.1, MCV of 88.1, platelets of 245.  Sodium 138, potassium 3.4,  chloride 108, CO2 of 26, glucose of 104, BUN  of 3, creatinine 0.8, calcium  8.6.  CK-MB is 1.9, troponin I is less than 0.05.  Drug screen negative.  Alcohol level less than 5.  INR is 1.1.   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   The patient currently is at or near his baseline and is completely normal at  this point.  The patient likely had a migrainous event but do need to  consider and rule out the possibility of a transient ischemic attack event  affecting the medullary circulation.  The patient will be admitted for  further evaluation.   PLAN:  1.  Recommend IV heparin at this point until further evaluation.  2.  MRI of the brain.  3.  MRI angiogram of intracranial and extracranial vessels.  4.  2 D echocardiogram.   Will follow the patient's clinical course while in-house.      CKW/MEDQ  D:  05/17/2004  T:  05/18/2004  Job:  [id number]   cc:   Thora Lance, M.D.  [address]  [place]  [place] [address]  Fax: [phone]

## 2010-08-13 NOTE — Assessment & Plan Note (Signed)
[facility]                               LIPID CLINIC NOTE   NAME:Zielinski, Zamire                          MRN:          [id number]  DATE:10/10/2007                            DOB:          04/30/1950    I had left a message for the patient on October 05, 2007, that I had  received his life science panel and ended up mailing a copy of it to him  to discuss.  In the life science panel, we found LDL particle that were  extremely high at 3732.  Small LDL particle again extremely high at  3187.  LDL-C was calculated at 215, HDL 44, triglycerides 276, and total  cholesterol 314.  All of the particle sizes were small and dense.  I  have discussed this with Mr. Mccreadie at length and answered his questions.  I had shared with him that with the significant of dyslipidemia, it is  most appropriate to begin with a high-potency statin.  The patient has  elected to rechallenge with Crestor.  We will began with a 20 mg daily  dose.  He will take it at dinner instead of previously when took it in  the morning.  He will take it with food to quell nausea.  He will call  me in 5 days to let me know how he is tolerating this.  He will call me  sooner if need be.  If the patient is able to tolerate it, we will check  labs in 6 weeks to verify tolerance and proceed accordingly.  I have  discussed with him that I will not check life science panel for at least  6 months, as it takes this long for these changes to demonstrate  variation.      Shelby Dubin, PharmD, BCPS, CPP  Electronically Signed      Rollene Rotunda, MD, [facility]  Electronically Signed   MP/MedQ  DD: 10/23/2007  DT: 10/24/2007  Job #: [id number]   cc:   Titus Dubin. Alwyn Ren, MD,FACP,FCCP

## 2010-08-13 NOTE — H&P (Signed)
NAMERAYSHON, ALBAUGH NO.:  [id number]   MEDICAL RECORD NO.:  [id number]          PATIENT TYPE:  INP   LOCATION:  1830                         FACILITY:  [facility]   PHYSICIAN:  Thora Lance, M.D.  DATE OF BIRTH:  December 04, 1950   DATE OF ADMISSION:  05/17/2004  DATE OF DISCHARGE:                                HISTORY & PHYSICAL   CHIEF COMPLAINT:  Weakness.   HISTORY OF PRESENT ILLNESS:  This 60 year old white male in good health who  presented with sudden onset today of inability to focus vision followed by  severe vertigo, nausea, and then severe weakness in all extremities.  He  could not walk or lift his arms or legs.  He denied any sensory changes.  This patient had to be taken out of the office in a wheelchair and then to  Urgent Care and then wheeled out of Urgent Care to the ER.  His vertigo  lasted about an hour with gradual resolution in his weakness over several  hours.  He had a mild headache after the vertigo resolved.  He has no  history of migraine headaches.  He did have history of left hand numbness  about three to four weeks ago which lasted two to three hours, then  resolved.  He has a history of hyperlipidemia once treated with a statin,  but off medicine over the last year.   PAST MEDICAL HISTORY:  Hyperlipidemia.  He remembers cholesterol being 280.   PAST SURGICAL HISTORY:  Tonsillectomy.   ALLERGIES:  No known drug allergies.   CURRENT MEDICATIONS:  Aspirin 81 mg a day.   FAMILY HISTORY:  Mother died at age 4, CHF.  Father died at age 66, history  of CAD.  Sister in good health.   SOCIAL HISTORY:  Married.  Two daughters.  Discontinued tobacco and alcohol  use 15 years ago.  [employer].   REVIEW OF SYSTEMS:  Denies chest pain, shortness of breath, cough, abdominal  pain, blood in stool or urine, urinary problems, or edema.   PHYSICAL EXAMINATION:  GENERAL:  Well-appearing white male.  VITAL SIGNS: Blood  pressure 162/87, heart rate 64, temperature 98.8,  respirations 18, oxygen saturation 97% on room air.  HEENT:  Eyes:  Pupils equal, round, and respond to light.  Extraocular  movements intact.  Funduscopic normal.  Ears:  TMs are clear.  Oropharynx  clear.  NECK:  Supple.  No lymphadenopathy, no bruits.  LUNGS:  Clear.  HEART:  Regular rate and rhythm without murmur, gallop, or rub.  ABDOMEN:  Soft, nontender.  Normal bowel sounds.  No mass or  hepatosplenomegaly.  EXTREMITIES:  No edema.  NEUROLOGIC:  Alert and oriented x 3.  Cranial nerves II-XII intact.  Strength 5+/5 throughout.  Reflexes are 2/4 throughout, 1/4 in ankles.  Toes  downgoing.  Gait normal.  Cerebellar:  Finger-to-nose is well done.   LABORATORY DATA AND OTHER STUDIES:  CBC:  WBC 8.2, hemoglobin 15.5, platelet  count 245.  PT 14.0, INR 1.1, PTT 32.  Alcohol less than 5.  Sodium 138,  potassium 3.4, chloride 108, bicarbonate 26, glucose 104, BUN 8, creatinine  0.8, calcium 8.6. CK-MB 1.9, troponin I 0.05.  Urine drug screen negative.  Urinalysis pending.   CT scan of the brain showed no acute disease.   EKG shows normal sinus rhythm with left atrial enlargement, left axis  deviation, and incomplete right bundle branch block.   ASSESSMENT:  Rule out brain stem transient ischemic attack.   PLAN:  1.  Admit to telemetry.  2.  MRI of the brain.  3.  MRI of the intracranial and extracranial vessels.  4.  Heparin IV.  5.  Aspirin.  6.  A 2-D echocardiogram.  7.  Neurology consultation.  8.  Homocysteine and lipids.      JJG/MEDQ  D:  05/17/2004  T:  05/17/2004  Job:  [id number]   cc:   Ike Bene, M.D.  [address]  [place]  [place] [address]  Fax: [phone]

## 2010-08-13 NOTE — Consult Note (Signed)
NAMEDIAMANTE, RUBIN NO.:  [id number]   MEDICAL RECORD NO.:  [id number]          PATIENT TYPE:  INP   LOCATION:  3707                         FACILITY:  [facility]   PHYSICIAN:  Meade Maw, M.D.    DATE OF BIRTH:  [birth year]   DATE OF CONSULTATION:  05/18/2004  DATE OF DISCHARGE:                                   CONSULTATION   INDICATION FOR CONSULT:  Evaluation for possible TEE.   Arthur Escobar is a very pleasant 60 year old male who presented to the  hospital on May 17, 2004 with a sudden onset of inability to focus his  vision, followed by severe vertigo, nausea, and then severe weakness in all  extremities.  He could not walk or lift his arms.  He had no sensory  changes.  The patient presented to the office and then was subsequently  wheeled to the emergency room for further evaluation.  CT scan of the head  revealed no acute events.  Neurology was consulted.  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  The patient has been scheduled for a  transthoracic echocardiogram.  We are requested for a transesophageal  echocardiogram to eliminate further possible embolic events.   PAST MEDICAL HISTORY:  Dyslipidemia.   PAST SURGICAL HISTORY:  Tonsillectomy.   CURRENT MEDICATIONS:  Aspirin.   FAMILY HISTORY:  Mother is 40 and suffers from congestive heart failure.  Father is 89.  Has a history of coronary artery disease.   SOCIAL HISTORY:  The patient is married.  He has two daughters.  He lives in  [place].  He is a Retail buyer.   REVIEW OF SYSTEMS:  Negative.  The patient denies any difficulty with  swallowing.  He has had no neck problems.  He has no loose teeth.  He has no  GI difficulties.   PHYSICAL EXAMINATION:  GENERAL:  Well-appearing, middle-aged male in no  acute distress.  HEENT:  He has good dentition in place.  PULMONARY:  Reveals breath  sounds which are equal and clear to auscultation.  CARDIOVASCULAR:  Reveals a regular rate and rhythm.  Normal S1, normal S2.  No rubs, murmurs, or gallops noted.   IMPRESSION:  Possible transient ischemic attack.  The patient will be  scheduled for a transesophageal echocardiogram.  Risks, benefits, and  options of TEE were discussed with the patient, risks including difficulty  with  swallowing, possible loss of teeth, possible aspiration, possible allergic  reactions to medications for sedation, and possible respiratory arrest from  medication.  After considering his options, the patient wishes to proceed  with transesophageal echocardiogram as described.      HP/MEDQ  D:  05/18/2004  T:  05/18/2004  Job:  [id number]   cc:   Thora Lance, M.D.  [address]  [place]  [place] [address]  Fax: [phone]   C. Lesia Sago, M.D.  [address]  [address]  [place]  [place] [address]  Fax: [phone]

## 2010-08-13 NOTE — Op Note (Signed)
NAMEJEVEN, Arthur Escobar NO.:  [id number]   MEDICAL RECORD NO.:  [id number]          PATIENT TYPE:  INP   LOCATION:  3707                         FACILITY:  [facility]   PHYSICIAN:  Meade Maw, M.D.    DATE OF BIRTH:  1951-01-07   DATE OF PROCEDURE:  05/19/2004  DATE OF DISCHARGE:                                 OPERATIVE REPORT   INDICATIONS FOR PROCEDURE:  Evaluation for possible embolic event.   PROCEDURE:  After obtaining written informed consent, the patient was  brought to the endoscopy lab in the postabsorptive state.  Preobtundation  was achieved using Versed 5 mg IV and fentanyl 150 mcg IV.  Of note however,  it is unsure as to exactly how much Versed the patient received, as his  right IV access was probably infiltrated.  Following appropriate sedation  and topical anesthesia using Cetacaine spray, the Omni plain probe was  introduced, using digital guidance without difficulty.  Multiple views were  obtained at the mid esophageal basal and deep gastric view.  Color-Flow  Doppler was performed across the mitral, tricuspid, aortic and pulmonary  dome.  In the ascending and descending aortic arch was well visualized.  Bubble study was performed.   FINDINGS:  1.  Normal valvular morphology.  There was mild mitral regurgitation noted.  2.  Normal left ventricular dimension, with normal systolic function.  There      was prominent chorda tendineae noted.  3.  The right ventricle was grossly normal.  4.  Normal ascending and descending aortic arch.  5.  Left atrial appendage was visualized, and was normal.  6.  Pericardium was normal.  7.  Doppler study did demonstrate the presence of a very small patent      foramen ovale.   FINAL IMPRESSION:  Small patent foramen ovale, with minimal crossing of the  __________ from right to left.  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   RECOMMENDATIONS:  Per neurology  service.      HP/MEDQ  D:  05/19/2004  T:  05/19/2004  Job:  [id number]   cc:   Marlan Palau, M.D.  [address]  [address]  [place]  [place] [address]  Fax: [phone]   Thora Lance, M.D.  [address]  [place]  [place] [address]  Fax: [phone]

## 2010-08-13 NOTE — Discharge Summary (Signed)
NAMEKEYMARI, SATO NO.:  [id number]   MEDICAL RECORD NO.:  [id number]          PATIENT TYPE:  INP   LOCATION:  3313                         FACILITY:  [facility]   PHYSICIAN:  Hillis Range, MD       DATE OF BIRTH:  [birth year]   DATE OF ADMISSION:  03/05/2008  DATE OF DISCHARGE:  03/08/2008                               DISCHARGE SUMMARY   DISCHARGE DIAGNOSES:  1. Atrial flutter status post ablation therapy post flecainide.  2. Post termination pause disorder.  3. Mixed hyperlipidemia.  4. Obstructive sleep apnea.   HOSPITAL COURSE:  Mr. Costlow is a 60 year old male patient who came into  the office on the date of admission complaining of shortness of breath  and lightheadedness within the past 24 hours.  He has a history of  atrial fibrillation and was hospitalized in January 2009 and  cardioverted and placed on flecainide therapy.  He initially was placed  on Coumadin and he was stayed on that until September 2009 and this was  stopped since he had no recurrence of atrial fibrillation.  Incidentally, he is known to have a patent foramen ovale.   Upon presentation to the clinic, he was found to be back in atrial  fibrillation with RVR.  He was admitted.  He was started on IV Cardizem  in addition to his regular flecainide therapy.  Upon conversion to  normal sinus rhythm, he manifested a 12-second pause before the  junctional escape beat with a complete heart block for another minute  and then proceeded to be back in normal sinus rhythm.   We consulted [place] [facility] physicians and ultimately he underwent ablation  procedure under the care of Dr. Johney Frame.  The ablation was successful and  he achieved bidirectional isthmus block.  No inducible ischemia.  Coumadin was recommended for [redacted] weeks along with flecainide.   By March 08, 2008, he was felt safe to go home.  He seemed to be  doing well from this procedure.   LABORATORY DATA:  During this hospitalization  included hemoglobin 14.3,  hematocrit 42.3, white count 8.8, and platelets 196.  PT 21.4 and INR  1.8.  Total cholesterol 130, triglycerides 111, HDL 35, and LDL 71.  TSH  0.725.  BNP 245.  Sodium 138, potassium 3.8, BUN 14, and creatinine  0.91.  Chest x-ray:  No acute abnormalities.   ADDENDUM STATEMENT:  The patient was initially planned to go home on  Coumadin.  With further discussion with electrophysiology doctors, it  was recommended that he only go home on aspirin.   DISCHARGE MEDICATIONS:  1. Flecainide 100 mg every 12 hours.  2. Crestor 20 mg a day.  3. Paxil 20 mg a day.  4. Enteric-coated aspirin 325 mg a day.   DISCHARGE CONDITION:  The patient is being discharged to home in stable  but improved condition.   DISCHARGE INSTRUCTIONS:  The patient is to remain on a low-sodium heart-  healthy diet.  Increase activity slowly.  No lifting over 10 pounds for  1 week.  No driving for 2  days.  Clean puncture site area gently with  soap and water.  No scrubbing.  Follow up with Dr. Deitra Mayo,  nurse practitioner on March 31, 2008 at 9:30 a.m.      Guy Franco, P.A.      Hillis Range, MD  Electronically Signed    LB/MEDQ  D:  06/12/2008  T:  06/13/2008  Job:  [id number]

## 2010-08-13 NOTE — Discharge Summary (Signed)
NAMESUNNY, GAINS NO.:  [id number]   MEDICAL RECORD NO.:  [id number]          PATIENT TYPE:  INP   LOCATION:  3707                         FACILITY:  [facility]   PHYSICIAN:  Thora Lance, M.D.  DATE OF BIRTH:  03-28-1951   DATE OF ADMISSION:  05/17/2004  DATE OF DISCHARGE:  05/20/2004                                 DISCHARGE SUMMARY   REASON FOR ADMISSION:  A 60 year old white male in good health presented  with a sudden onset while at work with the inability to focus vision  followed by severe vertigo, nausea, and then severe weakness in all  extremities.  He could not lift his arms or legs.  His vertigo lasted an  hour.  It was then followed by a mild headache.  The patient had to be  wheeled out of his office in a wheelchair, sent to urgent care, and  eventually to the emergency room.  His weakness gradually resolved in the ER  over several hours.  The patient had one episode of left hand numbness  lasting two to three hours about three to four weeks prior to admission.  He  has a history of hyperlipidemia and has been off Zocor for a year.   PHYSICAL EXAMINATION:  VITAL SIGNS:  Blood pressure 172/87, heart rate 64,  respirations 18, temperature 98.8, oxygen saturation 97% on room air.  HEENT:  Pupils equal, round, respond to light.  Extraocular movements  intact.  Fundi normal.  Oropharynx is clear.  NECK:  Supple.  LUNGS:  Clear.  HEART:  Regular rate and rhythm without murmurs, rubs, or gallops.  ABDOMEN:  Soft, nontender.  Normal bowel sounds.  No mass or  hepatosplenomegaly.  EXTREMITIES:  No edema.  NEUROLOGIC:  Alert and oriented x3.  Cranial nerves II-XII are intact.  Strength is 5+/5 all extremities when I examined him.  Reflexes 2/4  throughout, 1/4 bilateral ankle reflexes.  Toes downgoing.  Gait normal.  Cerebellar:  Finger-to-nose well done.   LABORATORIES:  CBC:  WBC 8.2, hemoglobin 15.5, platelet count 245.  PT 14.0,  INR 0.51, PTT 32.   Chemistries:  Sodium 138, potassium 3.4, chloride 108,  bicarbonate 26, BUN 8, creatinine 0.8, glucose 104.  CK-MB 1.9, troponin I  __________ .  Urine drug screen negative.  Urinalysis negative.  CT scan of  the brain showed no acute disease.  EKG showed normal sinus rhythm, left  atrial enlargement, left axis deviation, incomplete left bundle branch  block.   HOSPITAL COURSE:  The patient was admitted for a possible TIA versus a  basilar migraine.  Consultation from neurology was obtained.  Patient was  started on IV heparin as well as aspirin.  An MRI and an MRA of the brain  were done and was unremarkable per neurology.  2-D echocardiogram was done  and normal.  The patient had no recurrent neurologic symptoms throughout his  entire hospitalization.  At the recommendation of the neurology service a  transesophageal echocardiogram was obtained and done by Dr. Fraser Din.  This  showed a small patent foramen ovale.  The patient was seen by neurology the  next morning and felt the most likely etiology was still a complicated  migraine.  It was felt that the PFO was incidental.  Recommendation was made  to discharge the patient home on aspirin 325 mg daily.  Consideration to  treating lipids was also recommended.  A bubble study at Dr. Marlis Edelson office  on June 09, 2004 at 3:00 was arranged.  Avoidance of heavy physical  exertion and deep sea diving was recommended by neurology.   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   PROCEDURES:  1.  CT scan of the brain.  2.  MRI of the brain.  3.  MRA of the intra and extracranial vessels.  4.  2-D echocardiogram.  5.  Transesophageal echocardiogram.   MEDICATIONS:  Aspirin 325 mg daily.   ACTIVITY:  No restrictions.   DIET:  Low fat diet.   FOLLOW-UP:  Followup with Dr. Pearlean Brownie of the stroke team in two to three  months.  A bubble study will be arranged as per above.  Follow up with  primary M.D. for consideration of lipid-lowering  therapy.      JJG/MEDQ  D:  05/21/2004  T:  05/21/2004  Job:  [id number]   cc:   Pramod P. Pearlean Brownie, MD  Fax: [phone]   Ike Bene, M.D.  [address]  [place]  [place] [address]  Fax: [phone]

## 2010-09-16 ENCOUNTER — Other Ambulatory Visit: Payer: Self-pay | Admitting: Internal Medicine

## 2010-09-16 NOTE — Telephone Encounter (Signed)
Repeat fasting NMR Lipoprofile (272.4)after 4 months of dietary changes & see me 7-10 days later to discuss long term risks. Hopp  Copied from 01/2010, labs were due 05/2010

## 2010-12-13 ENCOUNTER — Ambulatory Visit (INDEPENDENT_AMBULATORY_CARE_PROVIDER_SITE_OTHER): Payer: Self-pay | Admitting: Internal Medicine

## 2010-12-13 ENCOUNTER — Encounter: Payer: Self-pay | Admitting: Internal Medicine

## 2010-12-13 VITALS — BP 136/78 | HR 62 | Temp 97.8°F | Wt 212.8 lb

## 2010-12-13 DIAGNOSIS — R05 Cough: Secondary | ICD-10-CM

## 2010-12-13 MED ORDER — AZITHROMYCIN 250 MG PO TABS: ORAL_TABLET | ORAL | Status: AC

## 2010-12-13 MED ORDER — MOMETASONE FUROATE 220 MCG/INH IN AEPB: 1.0000 | INHALATION_SPRAY | Freq: Two times a day (BID) | RESPIRATORY_TRACT | Status: DC

## 2010-12-13 MED ORDER — PREDNISONE 20 MG PO TABS: 20.0000 mg | ORAL_TABLET | Freq: Two times a day (BID) | ORAL | Status: AC

## 2010-12-13 NOTE — Progress Notes (Signed)
  Subjective:    Patient ID: Arthur Escobar, male    DOB: 20-May-1950, 60 y.o.   MRN: [id number]  HPI Cough Onset:3 weeks ago as PNDrainage Extrinsic symptoms:itchy eyes, sneezing:no  Infectious symptoms :fever, purulent secretions :no Chest symptoms:pleuritic  pain, sputum production, hemoptysis,dyspnea: no,. Some wheezing supine GI symptoms: Dyspepsia, reflux: no Occupational/environmental exposures: no Smoking: quit 1990 ACE inhibitor:no Treatment/efficacy:Alevert, Mucinex Sinus, Robitussin Cough & Cold Past medical history/family history pulmonary disease: no     Review of Systems The major and minor symptoms of rhinosinusitis were reviewed. He denies nasal congestion/obstruction; nasal purulence; facial pain; anosmia; fatigue; fever; headache; halitosis; earache and dental pain.     Objective:   Physical Exam General appearance is of good health and nourishment; no acute distress or increased work of breathing is present.  No  lymphadenopathy about the head, neck, or axilla noted.   Eyes: No conjunctival inflammation or lid edema is present.   Ears:  External ear exam shows no significant lesions or deformities.  Otoscopic examination reveals clear canals, tympanic membranes are intact bilaterally without bulging, retraction, inflammation or discharge.  Nose:  External nasal examination shows no deformity or inflammation. Nasal mucosa are pink and moist without lesions or exudates. Septal dislocation slightly to R.No obstruction to airflow.   Oral exam: Dental hygiene is good; lips and gums are healthy appearing.There is no oropharyngeal erythema or exudate noted.   Neck:  No deformities, thyromegaly, masses, or tenderness noted.   Supple with full range of motion without pain.   Heart:  Normal rate and regular rhythm. S1 and S2 normal without gallop, murmur, click, rub or other extra sounds.   Lungs:Chest clear to auscultation; no wheezes, rhonchi,rales ,or rubs present.No  increased work of breathing.    Extremities:  No cyanosis, edema, or clubbing  noted    Skin: Warm & dry           Assessment & Plan:  #1 cough, possible component of reactive airways disease.  Plan: See orders and recommendations.

## 2010-12-13 NOTE — Patient Instructions (Signed)
Plain Mucinex for thick secretions ;force NON dairy fluids for next 48 hrs. Use a Neti pot daily as needed for sinus congestion 

## 2010-12-15 LAB — CBC
HCT: 44.5
Hemoglobin: 15.2
MCHC: 34.2
MCV: 89.1
MCV: 89.7
Platelets: 234
Platelets: 257
RBC: 4.99
RDW: 13.2
WBC: 12 — ABNORMAL HIGH
WBC: 6.9

## 2010-12-15 LAB — COMPREHENSIVE METABOLIC PANEL
ALT: 164 — ABNORMAL HIGH
Albumin: 3.9
Alkaline Phosphatase: 50
Potassium: 3.7
Sodium: 137
Total Protein: 5.9 — ABNORMAL LOW

## 2010-12-15 LAB — CK TOTAL AND CKMB (NOT AT ARMC)
CK, MB: 3.7
Relative Index: INVALID
Total CK: 82

## 2010-12-15 LAB — HEPARIN LEVEL (UNFRACTIONATED): Heparin Unfractionated: 0.34

## 2010-12-15 LAB — HEMOGLOBIN A1C: Hgb A1c MFr Bld: 5.1

## 2010-12-15 LAB — BASIC METABOLIC PANEL
Chloride: 108
Creatinine, Ser: 0.82
GFR calc Af Amer: >60
Potassium: 3.9
Sodium: 141

## 2010-12-15 LAB — DIFFERENTIAL
Basophils Absolute: 0.1
Basophils Relative: 1
Eosinophils Absolute: 0.2
Eosinophils Relative: 1
Lymphocytes Relative: 16
Lymphs Abs: 2
Monocytes Absolute: 0.8
Monocytes Relative: 7
Neutro Abs: 9.1 — ABNORMAL HIGH
Neutrophils Relative %: 75

## 2010-12-15 LAB — APTT: aPTT: 31

## 2010-12-15 LAB — CARDIAC PANEL(CRET KIN+CKTOT+MB+TROPI)
CK, MB: 3.6
Relative Index: INVALID
Total CK: 63
Troponin I: 0.1 — ABNORMAL HIGH

## 2010-12-15 LAB — LIPID PANEL
Triglycerides: 55
VLDL: 11

## 2010-12-15 LAB — PROTIME-INR
INR: 1.2
Prothrombin Time: 15.1

## 2010-12-15 LAB — COMPREHENSIVE METABOLIC PANEL WITH GFR
AST: 93 — ABNORMAL HIGH
BUN: 6
CO2: 25
Calcium: 8.8
Chloride: 105
Creatinine, Ser: 0.9
GFR calc Af Amer: >60
GFR calc non Af Amer: >60
Glucose, Bld: 105 — ABNORMAL HIGH
Total Bilirubin: 0.7

## 2010-12-15 LAB — MAGNESIUM: Magnesium: 2

## 2010-12-15 LAB — TROPONIN I: Troponin I: 0.07 — ABNORMAL HIGH

## 2010-12-15 LAB — PHOSPHORUS: Phosphorus: 3.7

## 2010-12-16 LAB — PROTIME-INR
INR: 1.2
Prothrombin Time: 15.1
Prothrombin Time: 16.3 — ABNORMAL HIGH

## 2010-12-16 LAB — CBC
Hemoglobin: 14
MCHC: 34.4
Platelets: 220
RDW: 13.2
RDW: 13.4

## 2010-12-16 LAB — HEPARIN LEVEL (UNFRACTIONATED): Heparin Unfractionated: 0.22 — ABNORMAL LOW

## 2010-12-31 LAB — LIPID PANEL
Cholesterol: 130 mg/dL (ref 0–200)
LDL Cholesterol: 71 mg/dL (ref 0–99)
VLDL: 24 mg/dL (ref 0–40)

## 2010-12-31 LAB — CBC
HCT: 42.3 % (ref 39.0–52.0)
HCT: 47.7 % (ref 39.0–52.0)
Hemoglobin: 14.7 g/dL (ref 13.0–17.0)
Hemoglobin: 16.2 g/dL (ref 13.0–17.0)
MCHC: 33.9 g/dL (ref 30.0–36.0)
MCV: 90.2 fL (ref 78.0–100.0)
MCV: 91.1 fL (ref 78.0–100.0)
Platelets: 196 10*3/uL (ref 150–400)
Platelets: 280 10*3/uL (ref 150–400)
RBC: 4.64 MIL/uL (ref 4.22–5.81)
RBC: 4.75 MIL/uL (ref 4.22–5.81)
RBC: 5.23 MIL/uL (ref 4.22–5.81)
RDW: 13.3 % (ref 11.5–15.5)
WBC: 13.1 10*3/uL — ABNORMAL HIGH (ref 4.0–10.5)
WBC: 7 10*3/uL (ref 4.0–10.5)
WBC: 8.8 10*3/uL (ref 4.0–10.5)

## 2010-12-31 LAB — COMPREHENSIVE METABOLIC PANEL
ALT: 28 U/L (ref 0–53)
AST: 28 U/L (ref 0–37)
Albumin: 3.6 g/dL (ref 3.5–5.2)
Alkaline Phosphatase: 48 U/L (ref 39–117)
BUN: 14 mg/dL (ref 6–23)
CO2: 20 mEq/L (ref 19–32)
Calcium: 9 mg/dL (ref 8.4–10.5)
Chloride: 115 mEq/L — ABNORMAL HIGH (ref 96–112)
Creatinine, Ser: 0.91 mg/dL (ref 0.4–1.5)
GFR calc Af Amer: >60 mL/min (ref >60)
GFR calc non Af Amer: >60 mL/min (ref >60)
Glucose, Bld: 125 mg/dL — ABNORMAL HIGH (ref 70–99)
Potassium: 3.8 mEq/L (ref 3.5–5.1)
Sodium: 138 mEq/L (ref 135–145)
Total Bilirubin: 0.8 mg/dL (ref 0.3–1.2)
Total Protein: 6.2 g/dL (ref 6.0–8.3)

## 2010-12-31 LAB — CARDIAC PANEL(CRET KIN+CKTOT+MB+TROPI)
CK, MB: 3.4 ng/mL (ref 0.3–4.0)
Troponin I: 0.13 ng/mL — ABNORMAL HIGH (ref 0.00–0.06)

## 2010-12-31 LAB — PROTIME-INR
INR: 1.1 (ref 0.00–1.49)
INR: 1.2 (ref 0.00–1.49)
INR: 1.8 — ABNORMAL HIGH (ref 0.00–1.49)
Prothrombin Time: 14.8 seconds (ref 11.6–15.2)
Prothrombin Time: 15.5 seconds — ABNORMAL HIGH (ref 11.6–15.2)

## 2010-12-31 LAB — CK TOTAL AND CKMB (NOT AT ARMC)
CK, MB: 3.5 ng/mL (ref 0.3–4.0)
Total CK: 104 U/L (ref 7–232)

## 2010-12-31 LAB — TROPONIN I: Troponin I: 0.06 ng/mL (ref 0.00–0.06)

## 2010-12-31 LAB — HEPARIN LEVEL (UNFRACTIONATED)
Heparin Unfractionated: 0.2 IU/mL — ABNORMAL LOW (ref 0.30–0.70)
Heparin Unfractionated: 0.51 IU/mL (ref 0.30–0.70)
Heparin Unfractionated: 0.54 IU/mL (ref 0.30–0.70)

## 2010-12-31 LAB — APTT: aPTT: 33 seconds (ref 24–37)

## 2010-12-31 LAB — MAGNESIUM: Magnesium: 2.4 mg/dL (ref 1.5–2.5)

## 2011-01-07 LAB — I-STAT 8, (EC8 V) (CONVERTED LAB)
BUN: 11
Bicarbonate: 26 — ABNORMAL HIGH
Glucose, Bld: 82
Operator id: 279831
pCO2, Ven: 40.1 — ABNORMAL LOW

## 2011-01-07 LAB — D-DIMER, QUANTITATIVE: D-Dimer, Quant: 0.3

## 2012-06-01 ENCOUNTER — Encounter: Payer: Self-pay | Admitting: Internal Medicine

## 2012-08-09 ENCOUNTER — Ambulatory Visit (INDEPENDENT_AMBULATORY_CARE_PROVIDER_SITE_OTHER): Payer: BC Managed Care – PPO | Admitting: Internal Medicine

## 2012-08-09 ENCOUNTER — Encounter: Payer: Self-pay | Admitting: Internal Medicine

## 2012-08-09 VITALS — BP 138/90 | HR 55 | Temp 97.8°F | Resp 12 | Ht 73.0 in | Wt 204.8 lb

## 2012-08-09 DIAGNOSIS — I4891 Unspecified atrial fibrillation: Secondary | ICD-10-CM

## 2012-08-09 DIAGNOSIS — E781 Pure hyperglyceridemia: Secondary | ICD-10-CM

## 2012-08-09 DIAGNOSIS — I10 Essential (primary) hypertension: Secondary | ICD-10-CM

## 2012-08-09 DIAGNOSIS — G5600 Carpal tunnel syndrome, unspecified upper limb: Secondary | ICD-10-CM | POA: Insufficient documentation

## 2012-08-09 DIAGNOSIS — Z1211 Encounter for screening for malignant neoplasm of colon: Secondary | ICD-10-CM

## 2012-08-09 DIAGNOSIS — Z0000 Encounter for general adult medical examination without abnormal findings: Secondary | ICD-10-CM

## 2012-08-09 DIAGNOSIS — Z23 Encounter for immunization: Secondary | ICD-10-CM

## 2012-08-09 MED ORDER — TRAMADOL HCL 50 MG PO TABS: 50.0000 mg | ORAL_TABLET | Freq: Three times a day (TID) | ORAL | Status: DC | PRN

## 2012-08-09 NOTE — Patient Instructions (Addendum)
Minimal Blood Pressure Goal= AVERAGE < 140/90;  Ideal is an AVERAGE < 135/85. This AVERAGE should be calculated from @ least 5-7 BP readings taken @ different times of day on different days of week. You should not respond to isolated BP readings , but rather the AVERAGE for that week .Please bring your  blood pressure cuff to office visits to verify that it is reliable.It  can also be checked against the blood pressure device at the pharmacy. Finger or wrist cuffs are not dependable; an arm cuff is.  Use an anti-inflammatory cream such as Aspercreme or Zostrix cream twice a day to the thumbs as needed. In lieu of this warm moist compresses or  hot water bottle can be used. Do not apply ice . If you activate the  My Chart system; lab & Xray results will be released directly  to you as soon as I review & address these through the computer. If you choose not to sign up for My Chart within 36 hours of labs being drawn; results will be reviewed & interpretation added before being copied & mailed, causing a delay in getting the results to you.If you do not receive that report within 7-10 days ,please call. Additionally you can use this system to gain direct  access to your records  if  out of town or @ an office of a  physician who is not in  the My Chart network.  This improves continuity of care & places you in control of your medical record.

## 2012-08-09 NOTE — Progress Notes (Signed)
Subjective:    Patient ID: Arthur Escobar, male    DOB: Aug 06, 1950, 62 y.o.   MRN: [id number]  HPI He  is here for a physical;acute issues include joint pain @ base of thumbs. There was no associated injury or trigger .Pain is described as  sharp  up to level 8. The pain does not radiate.The discomfort last seconds .It is exacerbated by gripping. No associated redness, swelling, stiffness, skin color change, or  temperature change The pain was treated with NSAIDS with partial insignificant        Review of Systems Constitutional: no fever, chills, or sweats. Purposeful loss of weight . Musculoskeletal:no  muscle cramps or pain Neuro: no weakness;  numbness and tingling only with CTS Heme:no lymphadenopathy; abnormal bruising or bleeding                                                          Objective:   Physical Exam Gen.: Thin but healthy and well-nourished in appearance. Alert, appropriate and cooperative throughout exam. Head: Normocephalic without obvious abnormalities; pattern alopecia  Eyes: No corneal or conjunctival inflammation noted. Pupils equal round reactive to light and accommodation. Extraocular motion intact. Vision grossly normal with lenses Ears: External  ear exam reveals no significant lesions or deformities. Canals clear .TMs normal. Hearing is grossly decreased on R. Nose: External nasal exam reveals no deformity or inflammation. Nasal mucosa are pink and moist. No lesions or exudates noted. Septum slightly dislocated  Mouth: Oral mucosa and oropharynx reveal no lesions or exudates. Teeth in good repair. Neck: No deformities, masses, or tenderness noted. Range of motion & Thyroid normal. Lungs: Normal respiratory effort; chest expands symmetrically. Lungs are clear to auscultation without rales, wheezes, or increased work of breathing. Heart: Normal rate and rhythm. Normal S1 and S2. No gallop, click, or rub. S4 w/o murmur. Abdomen: Bowel sounds normal;  abdomen soft and nontender. No masses, organomegaly or hernias noted. Genitalia: Genitalia normal except for left varices. Prostate is minimally asymmetric with L lobe minimally larger than R w/o nodularity or induration.                                 Musculoskeletal/extremities: No deformity or scoliosis noted of  the thoracic or lumbar spine.  No clubbing, cyanosis, edema, or significant extremity  deformity noted. Range of motion normal .Tone & strength  Normal. Joints normal but pain with ROM @ base of thumbs. Nail health good. Able to lie down & sit up w/o help. Negative SLR bilaterally Vascular: Carotid, radial artery, dorsalis pedis and  posterior tibial pulses are full and equal. No bruits present. Neurologic: Alert and oriented x3. Deep tendon reflexes symmetrical and normal.  Positive Tinel's RUE       Skin: Intact without suspicious lesions or rashes. Lymph: No cervical, axillary, or inguinal lymphadenopathy present. Psych: Mood and affect are normal. Normally interactive  Assessment & Plan:  #1 comprehensive physical exam; no acute findings  Plan: see Orders  & Recommendations

## 2012-08-10 ENCOUNTER — Encounter: Payer: Self-pay | Admitting: Internal Medicine

## 2012-08-10 LAB — HEPATIC FUNCTION PANEL
ALT: 23 U/L (ref 0–53)
Total Bilirubin: 0.9 mg/dL (ref 0.3–1.2)

## 2012-08-10 LAB — BASIC METABOLIC PANEL
BUN: 13 mg/dL (ref 6–23)
CO2: 28 mEq/L (ref 19–32)
Chloride: 105 mEq/L (ref 96–112)
Glucose, Bld: 69 mg/dL — ABNORMAL LOW (ref 70–99)
Potassium: 3.5 mEq/L (ref 3.5–5.1)

## 2012-08-10 LAB — CBC WITH DIFFERENTIAL/PLATELET
Basophils Absolute: 0.1 10*3/uL (ref 0.0–0.1)
Eosinophils Relative: 2.1 % (ref 0.0–5.0)
HCT: 45 % (ref 39.0–52.0)
Lymphs Abs: 1.7 10*3/uL (ref 0.7–4.0)
MCV: 88.9 fl (ref 78.0–100.0)
Monocytes Absolute: 0.4 10*3/uL (ref 0.1–1.0)
Platelets: 251 10*3/uL (ref 150.0–400.0)
RDW: 13.3 % (ref 11.5–14.6)

## 2012-08-10 LAB — LIPID PANEL
Cholesterol: 230 mg/dL — ABNORMAL HIGH (ref 0–200)
Total CHOL/HDL Ratio: 5
Triglycerides: 150 mg/dL — ABNORMAL HIGH (ref 0.0–149.0)

## 2012-09-04 ENCOUNTER — Ambulatory Visit (INDEPENDENT_AMBULATORY_CARE_PROVIDER_SITE_OTHER): Payer: BC Managed Care – PPO | Admitting: General Practice

## 2012-09-04 DIAGNOSIS — Z23 Encounter for immunization: Secondary | ICD-10-CM

## 2012-09-25 ENCOUNTER — Encounter: Payer: Self-pay | Admitting: Internal Medicine

## 2012-09-25 ENCOUNTER — Ambulatory Visit (AMBULATORY_SURGERY_CENTER): Payer: BC Managed Care – PPO

## 2012-09-25 VITALS — Ht 73.0 in | Wt 201.8 lb

## 2012-09-25 DIAGNOSIS — Z1211 Encounter for screening for malignant neoplasm of colon: Secondary | ICD-10-CM

## 2012-09-25 MED ORDER — MOVIPREP 100 G PO SOLR: ORAL | Status: DC

## 2012-10-09 ENCOUNTER — Ambulatory Visit (AMBULATORY_SURGERY_CENTER): Payer: BC Managed Care – PPO | Admitting: Internal Medicine

## 2012-10-09 ENCOUNTER — Encounter: Payer: Self-pay | Admitting: Internal Medicine

## 2012-10-09 VITALS — BP 138/85 | HR 47 | Temp 96.4°F | Resp 16 | Ht 73.0 in | Wt 201.0 lb

## 2012-10-09 DIAGNOSIS — D126 Benign neoplasm of colon, unspecified: Secondary | ICD-10-CM

## 2012-10-09 DIAGNOSIS — Z1211 Encounter for screening for malignant neoplasm of colon: Secondary | ICD-10-CM

## 2012-10-09 MED ORDER — SODIUM CHLORIDE 0.9 % IV SOLN: 500.0000 mL | INTRAVENOUS | Status: DC

## 2012-10-09 NOTE — Progress Notes (Signed)
Patient did not experience any of the following events: a burn prior to discharge; a fall within the facility; wrong site/side/patient/procedure/implant event; or a hospital transfer or hospital admission upon discharge from the facility. (G8907) Patient did not have preoperative order for IV antibiotic SSI prophylaxis. (G8918)  

## 2012-10-09 NOTE — Patient Instructions (Signed)
YOU HAD AN ENDOSCOPIC PROCEDURE TODAY AT THE [facility]: Refer to the procedure report that was given to you for any specific questions about what was found during the examination.  If the procedure report does not answer your questions, please call your gastroenterologist to clarify.  If you requested that your care partner not be given the details of your procedure findings, then the procedure report has been included in a sealed envelope for you to review at your convenience later.  YOU SHOULD EXPECT: Some feelings of bloating in the abdomen. Passage of more gas than usual.  Walking can help get rid of the air that was put into your GI tract during the procedure and reduce the bloating. If you had a lower endoscopy (such as a colonoscopy or flexible sigmoidoscopy) you may notice spotting of blood in your stool or on the toilet paper. If you underwent a bowel prep for your procedure, then you may not have a normal bowel movement for a few days.  DIET: Your first meal following the procedure should be a light meal and then it is ok to progress to your normal diet.  A half-sandwich or bowl of soup is an example of a good first meal.  Heavy or fried foods are harder to digest and may make you feel nauseous or bloated.  Likewise meals heavy in dairy and vegetables can cause extra gas to form and this can also increase the bloating.  Drink plenty of fluids but you should avoid alcoholic beverages for 24 hours.  ACTIVITY: Your care partner should take you home directly after the procedure.  You should plan to take it easy, moving slowly for the rest of the day.  You can resume normal activity the day after the procedure however you should NOT DRIVE or use heavy machinery for 24 hours (because of the sedation medicines used during the test).    SYMPTOMS TO REPORT IMMEDIATELY: A gastroenterologist can be reached at any hour.  During normal business hours, 8:30 AM to 5:00 PM Monday through Friday,  call [phone].  After hours and on weekends, please call the GI answering service at [phone] who will take a message and have the physician on call contact you.   Following lower endoscopy (colonoscopy or flexible sigmoidoscopy):  Excessive amounts of blood in the stool  Significant tenderness or worsening of abdominal pains  Swelling of the abdomen that is new, acute  Fever of 100F or higher    FOLLOW UP: If any biopsies were taken you will be contacted by phone or by letter within the next 1-3 weeks.  Call your gastroenterologist if you have not heard about the biopsies in 3 weeks.  Our staff will call the home number listed on your records the next business day following your procedure to check on you and address any questions or concerns that you may have at that time regarding the information given to you following your procedure. This is a courtesy call and so if there is no answer at the home number and we have not heard from you through the emergency physician on call, we will assume that you have returned to your regular daily activities without incident.  SIGNATURES/CONFIDENTIALITY: You and/or your care partner have signed paperwork which will be entered into your electronic medical record.  These signatures attest to the fact that that the information above on your After Visit Summary has been reviewed and is understood.  Full responsibility of the confidentiality   of this discharge information lies with you and/or your care-partner.     

## 2012-10-09 NOTE — Progress Notes (Signed)
Called to room to assist during endoscopic procedure.  Patient ID and intended procedure confirmed with present staff. Received instructions for my participation in the procedure from the performing physician.  

## 2012-10-09 NOTE — Progress Notes (Signed)
No egg or soy allergy 

## 2012-10-09 NOTE — Op Note (Signed)
 [facility] [address] [place], [address]   COLONOSCOPY PROCEDURE REPORT  PATIENT: Arthur Escobar, Arthur Escobar  MR#: [id number] BIRTHDATE: [birth year] , 61  yrs. old GENDER: Male ENDOSCOPIST: Beverley Fiedler, MD REFERRED XB:[id number] Alwyn Ren, M.D. PROCEDURE DATE:  10/09/2012 PROCEDURE:   Colonoscopy with cold biopsy polypectomy ASA CLASS:   Class II INDICATIONS:average risk screening and first colonoscopy. MEDICATIONS: MAC sedation, administered by CRNA and propofol (Diprivan) 250mg  IV  DESCRIPTION OF PROCEDURE:   After the risks benefits and alternatives of the procedure were thoroughly explained, informed consent was obtained.  A digital rectal exam revealed no rectal mass.   The LB [id number]  endoscope was introduced through the anus and advanced to the cecum, which was identified by both the appendix and ileocecal valve. No adverse events experienced. The quality of the prep was Moviprep fair  The instrument was then slowly withdrawn as the colon was fully examined.   COLON FINDINGS: A sessile polyp measuring 5 mm in size was found at the hepatic flexure.  A polypectomy was performed with cold forceps.  The resection was complete and the polyp tissue was completely retrieved.   The colon mucosa was otherwise normal. Retroflexed views revealed external hemorrhoids. The time to cecum=5 minutes 21 seconds.  Withdrawal time=13 minutes 00 seconds. The scope was withdrawn and the procedure completed.  COMPLICATIONS: There were no complications.  ENDOSCOPIC IMPRESSION: 1.   Sessile polyp measuring 5 mm in size was found at the hepatic flexure; polypectomy was performed with cold forceps 2.   The colon mucosa was otherwise normal  RECOMMENDATIONS: 1.  Await pathology results 2.  If the polyp removed today is proven to be an adenomatous (pre-cancerous) polyp, you will need a repeat colonoscopy in 5 years.  Otherwise you should continue to follow colorectal  cancer screening guidelines for "routine risk" patients with colonoscopy in 10 years.  You will receive a letter within 1-2 weeks with the results of your biopsy as well as final recommendations.  Please call my office if you have not received a letter after 3 weeks.   eSigned:  Beverley Fiedler, MD 10/09/2012 9:02 AM   cc: The Patient and Pecola Lawless, MD   PATIENT NAME:  Arthur Escobar, Arthur Escobar MR#: [id number]

## 2012-10-10 ENCOUNTER — Telehealth: Payer: Self-pay | Admitting: *Deleted

## 2012-10-10 NOTE — Telephone Encounter (Signed)
  Follow up Call-  Call back number 10/09/2012  Post procedure Call Back phone  # [phone]  Permission to leave phone message Yes     Patient questions:  Do you have a fever, pain , or abdominal swelling? no Pain Score  0 *  Have you tolerated food without any problems? yes  Have you been able to return to your normal activities? yes  Do you have any questions about your discharge instructions: Diet   no Medications  no Follow up visit  no  Do you have questions or concerns about your Care? no  Actions: * If pain score is 4 or above: No action needed, pain <4.

## 2012-10-16 ENCOUNTER — Encounter: Payer: Self-pay | Admitting: Internal Medicine

## 2013-01-31 ENCOUNTER — Other Ambulatory Visit: Payer: Self-pay

## 2013-03-11 ENCOUNTER — Telehealth: Payer: Self-pay

## 2013-03-11 MED ORDER — FLECAINIDE ACETATE 100 MG PO TABS: 100.0000 mg | ORAL_TABLET | Freq: Two times a day (BID) | ORAL | Status: DC

## 2013-03-11 NOTE — Telephone Encounter (Signed)
Rx sent in for pt.

## 2013-03-15 ENCOUNTER — Telehealth: Payer: Self-pay | Admitting: Cardiology

## 2013-03-15 NOTE — Telephone Encounter (Signed)
Received request from Nurse fax box, documents faxed for surgical clearance. To: Dr.Gerald Nazziola,DDS Fax number: [phone] Attention: 03/15/13/km

## 2013-04-01 ENCOUNTER — Telehealth: Payer: Self-pay | Admitting: Cardiology

## 2013-04-01 NOTE — Telephone Encounter (Signed)
Received request from Nurse fax box, documents faxed for surgical clearance. To: Dr.Gerald Docia Barrier Fax number: [phone] Attention: 04/01/13/km

## 2013-04-01 NOTE — Telephone Encounter (Signed)
New message    Pt needs clearance for dental work---dentist faxed form early dec but have not received it back.  Pls check on Dr Theodosia Blender desk and see if she still has it.  Pls call pt and let him know if he needs to have another form faxed to [place]

## 2013-04-01 NOTE — Telephone Encounter (Signed)
LVM to make pt aware we had faxed over the signed clearance on 03/15/13. We resent it again as well. Pt is cleared and oked to stop ASA 5 days prior to dental procedure.

## 2013-04-25 ENCOUNTER — Telehealth: Payer: Self-pay | Admitting: *Deleted

## 2013-04-25 MED ORDER — AMLODIPINE BESYLATE 2.5 MG PO TABS: 2.5000 mg | ORAL_TABLET | Freq: Every day | ORAL | Status: DC

## 2013-04-25 NOTE — Telephone Encounter (Signed)
Patient requests amlodipine refill to be sent to [facility]. Thanks, MI

## 2013-04-25 NOTE — Telephone Encounter (Signed)
Rx sent in for pt.

## 2013-04-29 ENCOUNTER — Other Ambulatory Visit: Payer: Self-pay | Admitting: General Surgery

## 2013-04-29 MED ORDER — AMLODIPINE BESYLATE 2.5 MG PO TABS: 2.5000 mg | ORAL_TABLET | Freq: Every day | ORAL | Status: DC

## 2013-06-05 ENCOUNTER — Ambulatory Visit: Payer: BC Managed Care – PPO | Admitting: Cardiology

## 2013-08-22 ENCOUNTER — Other Ambulatory Visit: Payer: Self-pay | Admitting: Cardiology

## 2013-09-23 ENCOUNTER — Other Ambulatory Visit: Payer: Self-pay | Admitting: *Deleted

## 2013-09-23 MED ORDER — AMLODIPINE BESYLATE 2.5 MG PO TABS: ORAL_TABLET | ORAL | Status: DC

## 2013-09-30 ENCOUNTER — Encounter: Payer: Self-pay | Admitting: Physician Assistant

## 2013-09-30 ENCOUNTER — Ambulatory Visit (INDEPENDENT_AMBULATORY_CARE_PROVIDER_SITE_OTHER): Payer: BC Managed Care – PPO | Admitting: Physician Assistant

## 2013-09-30 VITALS — BP 142/70 | HR 68 | Ht 73.0 in | Wt 205.0 lb

## 2013-09-30 DIAGNOSIS — I480 Paroxysmal atrial fibrillation: Secondary | ICD-10-CM

## 2013-09-30 DIAGNOSIS — E781 Pure hyperglyceridemia: Secondary | ICD-10-CM

## 2013-09-30 DIAGNOSIS — E785 Hyperlipidemia, unspecified: Secondary | ICD-10-CM

## 2013-09-30 DIAGNOSIS — I10 Essential (primary) hypertension: Secondary | ICD-10-CM

## 2013-09-30 DIAGNOSIS — I4891 Unspecified atrial fibrillation: Secondary | ICD-10-CM

## 2013-09-30 MED ORDER — FLECAINIDE ACETATE 100 MG PO TABS: 100.0000 mg | ORAL_TABLET | Freq: Two times a day (BID) | ORAL | Status: DC

## 2013-09-30 NOTE — Progress Notes (Signed)
HPI:  This is a 63 year old male patient of Dr. Golden Hurter who has a history of atrial flutter ablation and atrial fibrillation treated with flecainide. He also has benign essential hypertension. He comes in today for yearly followup. He denies any chest pain, palpitations, dyspnea, dyspnea on exertion, dizziness, or presyncope. 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 He has not had labs in over a year.   No Known Allergies  Current Outpatient Prescriptions on File Prior to Visit: amLODipine (NORVASC) 2.5 MG tablet, TAKE 1 TABLET (2.5 MG TOTAL) BY MOUTH DAILY., Disp: 30 tablet, Rfl: 0 aspirin 325 MG tablet, Take 325 mg by mouth daily.  , Disp: , Rfl:  flecainide (TAMBOCOR) 100 MG tablet, Take 1 tablet (100 mg total) by mouth 2 (two) times daily., Disp: 60 tablet, Rfl: 11 Multiple Vitamins-Minerals (CENTRUM SILVER PO), Take by mouth daily., Disp: , Rfl:   No current facility-administered medications on file prior to visit.   Past Medical History:   Hypertension                                                 Atrial fibrillation                                            Comment:since 2007   Dyslipidemia                                                 Sleep apnea                                                 Past Surgical History:   TONSILLECTOMY AND ADENOIDECTOMY                               FLEXIBLE SIGMOIDOSCOPY                                          Comment:negative; [facility]   ATRIAL ABLATION SURGERY                                      Review of patient's family history indicates:   Heart attack                   Father                     Comment: [facility] 6 vessel   CAD                            Father  Hypertension                   Father                   Stroke                         Neg Hx                   Diabetes                       Neg Hx                   Cancer                         Neg Hx                  Colon cancer                   Neg Hx                   Esophageal cancer              Neg Hx                   Stomach cancer                 Neg Hx                   Rectal cancer                  Neg Hx                   CVA                            Mother                   Social History   Marital Status: Married             Spouse Name:                      Years of Education:                 Number of children:             Occupational History   None on file  Social History Main Topics   Smoking Status: Former Smoker                   Packs/Day: 0.00  Years:           Quit date: 03/28/1988   Smokeless Status: Never Used                       Comment: Smoked [phone] , up to 1 ppd   Alcohol Use: No             Drug Use: No             Sexual Activity: Not on file        Other Topics            Concern   None on file  Social History  Narrative   None on file    ROS: See history of present illness otherwise negative   PHYSICAL EXAM: Well-nournished, in no acute distress. Neck: No JVD, HJR, Bruit, or thyroid enlargement  Lungs: No tachypnea, clear without wheezing, rales, or rhonchi  Cardiovascular: RRR, PMI not displaced, heart sounds normal, no murmurs, gallops, bruit, thrill, or heave.  Abdomen: BS normal. Soft without organomegaly, masses, lesions or tenderness.  Extremities: without cyanosis, clubbing or edema. Good distal pulses bilateral  SKin: Warm, no lesions or rashes   Musculoskeletal: No deformities  Neuro: no focal signs  BP 142/70  Pulse 68  Ht 6\' 1"  (1.854 m)  Wt 205 lb (92.987 kg)  BMI 27.05 kg/m2   EKG: Normal sinus rhythm with frequent PVCs

## 2013-09-30 NOTE — Assessment & Plan Note (Signed)
Patient has been on flecainide for a couple years for atrial fibrillation. He is in normal sinus rhythm with frequent PVCs which she has chronically had. Recommend CMET, CBC and fasting lipid panel. Followup with Dr. Radford Pax in 6 months.

## 2013-09-30 NOTE — Assessment & Plan Note (Signed)
Blood pressure is elevated today. Increase amlodipine to 5 mg once daily. 2 g sodium diet. Followup with Dr. Inda Merlin. He has an appointment next week at which time he'll get his labs drawn. We asked that they fax [place] his results.

## 2013-09-30 NOTE — Patient Instructions (Signed)
Your physician wants you to follow-up in: WITH DR. Radford Pax IN 6 MONTHS You will receive a reminder letter in the mail two months in advance. If you don't receive a letter, please call our office to schedule the follow-up appointment.  Your physician has recommended you make the following change in your medication:   INCREASE AMLODIPINE 5 MG ONCE A DAY  Your physician recommends that you return for lab work in: FASTING LIPIDS, CMET, CBC WITH YOU SEE DR. GATES

## 2013-09-30 NOTE — Assessment & Plan Note (Signed)
Recommend fasting lipid panel next week.

## 2013-10-14 ENCOUNTER — Other Ambulatory Visit: Payer: Self-pay

## 2013-10-14 MED ORDER — AMLODIPINE BESYLATE 5 MG PO TABS: ORAL_TABLET | ORAL | Status: DC

## 2014-01-10 ENCOUNTER — Other Ambulatory Visit: Payer: Self-pay

## 2014-03-03 ENCOUNTER — Encounter: Payer: Self-pay | Admitting: Cardiology

## 2014-05-02 ENCOUNTER — Encounter: Payer: Self-pay | Admitting: Cardiology

## 2014-05-02 ENCOUNTER — Ambulatory Visit (INDEPENDENT_AMBULATORY_CARE_PROVIDER_SITE_OTHER): Payer: Medicare Other | Admitting: Cardiology

## 2014-05-02 VITALS — BP 140/84 | HR 60 | Ht 73.0 in | Wt 209.8 lb

## 2014-05-02 DIAGNOSIS — E785 Hyperlipidemia, unspecified: Secondary | ICD-10-CM | POA: Diagnosis not present

## 2014-05-02 DIAGNOSIS — I480 Paroxysmal atrial fibrillation: Secondary | ICD-10-CM

## 2014-05-02 DIAGNOSIS — I493 Ventricular premature depolarization: Secondary | ICD-10-CM

## 2014-05-02 DIAGNOSIS — I10 Essential (primary) hypertension: Secondary | ICD-10-CM | POA: Diagnosis not present

## 2014-05-02 LAB — CBC WITH DIFFERENTIAL/PLATELET
Basophils Absolute: 0.1 10*3/uL (ref 0.0–0.1)
Basophils Relative: 0.7 % (ref 0.0–3.0)
EOS PCT: 1.8 % (ref 0.0–5.0)
Eosinophils Absolute: 0.1 10*3/uL (ref 0.0–0.7)
HCT: 47.3 % (ref 39.0–52.0)
Hemoglobin: 16.6 g/dL (ref 13.0–17.0)
LYMPHS ABS: 1.5 10*3/uL (ref 0.7–4.0)
LYMPHS PCT: 21.4 % (ref 12.0–46.0)
MCHC: 35 g/dL (ref 30.0–36.0)
MCV: 87.8 fl (ref 78.0–100.0)
MONO ABS: 0.6 10*3/uL (ref 0.1–1.0)
Monocytes Relative: 8.5 % (ref 3.0–12.0)
NEUTROS ABS: 4.9 10*3/uL (ref 1.4–7.7)
Neutrophils Relative %: 67.6 % (ref 43.0–77.0)
PLATELETS: 251 10*3/uL (ref 150.0–400.0)
RBC: 5.39 Mil/uL (ref 4.22–5.81)
RDW: 13 % (ref 11.5–15.5)
WBC: 7.2 10*3/uL (ref 4.0–10.5)

## 2014-05-02 LAB — BASIC METABOLIC PANEL
BUN: 11 mg/dL (ref 6–23)
CALCIUM: 9.5 mg/dL (ref 8.4–10.5)
CO2: 28 mEq/L (ref 19–32)
CREATININE: 0.76 mg/dL (ref 0.40–1.50)
Chloride: 106 mEq/L (ref 96–112)
GFR: 109.36 mL/min (ref >60.00)
GLUCOSE: 86 mg/dL (ref 70–99)
POTASSIUM: 4 meq/L (ref 3.5–5.1)
Sodium: 139 mEq/L (ref 135–145)

## 2014-05-02 MED ORDER — RIVAROXABAN 20 MG PO TABS: 20.0000 mg | ORAL_TABLET | Freq: Every day | ORAL | Status: DC

## 2014-05-02 NOTE — Patient Instructions (Signed)
Your physician has recommended you make the following change in your medication:  1) STOP ASPIRIN 2) START XARELTO 20 mg daily  Your physician recommends that you have lab work TODAY (BMET, CBC).  Your physician wants you to follow-up in: 1 year with Dr. Radford Pax. You will receive a reminder letter in the mail two months in advance. If you don't receive a letter, please call our office to schedule the follow-up appointment.

## 2014-05-02 NOTE — Progress Notes (Signed)
Cardiology Office Note   Date:  05/02/2014   ID:  Arthur Escobar, DOB [birth year], MRN [id number]  PCP:  Unice Cobble, MD  Cardiologist:   Sueanne Margarita, MD   No chief complaint on file.     History of Present Illness: This is a 64 year old male patient who has a history of atrial flutter ablation and atrial fibrillation treated with flecainide. He also has benign essential hypertension. He comes in today for yearly followup. He denies any chest pain, palpitations, dyspnea, dyspnea on exertion, dizziness, Le edema or presyncope. 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     Past Medical History  Diagnosis Date  . Hypertension   . Atrial fibrillation     since 2007  . Dyslipidemia   . Sleep apnea     intolerant to CPAP    Past Surgical History  Procedure Laterality Date  . Tonsillectomy and adenoidectomy    . Flexible sigmoidoscopy      negative; [place]  . Atrial ablation surgery       Current Outpatient Prescriptions  Medication Sig Dispense Refill  . amLODipine (NORVASC) 5 MG tablet TAKE 1 TABLET  BY MOUTH DAILY. 30 tablet 6  . aspirin 325 MG tablet Take 325 mg by mouth daily.      . flecainide (TAMBOCOR) 100 MG tablet Take 1 tablet (100 mg total) by mouth 2 (two) times daily. 60 tablet 11  . Multiple Vitamins-Minerals (CENTRUM SILVER PO) Take by mouth daily.     No current facility-administered medications for this visit.    Allergies:   Review of patient's allergies indicates no known allergies.    Social History:  The patient  reports that he quit smoking about 26 years ago. He has never used smokeless tobacco. He reports that he does not drink alcohol or use illicit drugs.   Family History:  The patient's family history includes CAD in his father; CVA in his mother; Heart attack (age of onset: 4) in his father; Hypertension in his father. There is no history of Stroke, Diabetes, Cancer, Colon cancer, Esophageal cancer, Stomach  cancer, or Rectal cancer.    ROS:  Please see the history of present illness.   Otherwise, review of systems are positive for none.   All other systems are reviewed and negative.    PHYSICAL EXAM: VS:  BP 140/84 mmHg  Pulse 60  Ht 6\' 1"  (1.854 m)  Wt 209 lb 12.8 oz (95.165 kg)  BMI 27.69 kg/m2  SpO2 95% , BMI Body mass index is 27.69 kg/(m^2). GEN: Well nourished, well developed, in no acute distress HEENT: normal Neck: no JVD, carotid bruits, or masses Cardiac: RRR; no murmurs, rubs, or gallops,no edema  Respiratory:  clear to auscultation bilaterally, normal work of breathing GI: soft, nontender, nondistended, + BS MS: no deformity or atrophy Skin: warm and dry, no rash Neuro:  Strength and sensation are intact Psych: euthymic mood, full affect   EKG:  EKG was ordered today and showed sinus bradycardia at 57bpm with PVC's  And LAFB with QRS duration 124msec.      Recent Labs: No results found for requested labs within last 365 days.    Lipid Panel    Component Value Date/Time   CHOL 230* 08/09/2012 1451   TRIG 150.0* 08/09/2012 1451   HDL 43.80 08/09/2012 1451   CHOLHDL 5 08/09/2012 1451   VLDL 30.0 08/09/2012 1451   LDLCALC  03/06/2008 0350  71        Total Cholesterol/HDL:CHD Risk Coronary Heart Disease Risk Table                     Men   Women  1/2 Average Risk   3.4   3.3   LDLDIRECT 157.3 08/09/2012 1451      Wt Readings from Last 3 Encounters:  05/02/14 209 lb 12.8 oz (95.165 kg)  09/30/13 205 lb (92.987 kg)  10/09/12 201 lb (91.173 kg)        ASSESSMENT AND PLAN:  1.  Paroxysmal atrial fibrillation - maintaining NSR on Flecainide.  His CHADS2VASC score is now 2 since he has turned 24 (HTN and age 57)  Therefore will start Xarelto 20mg  daily. Stop ASA.   Check NOAC panel today. 2.  HTN - controlled on amlodipine 3.  Dyslipidemia - followed by PCP 4.  PVC's - asymptomatic   Current medicines are reviewed at length with the patient today.   The patient had some concerns regarding Xarelto and we discussed the risk/benefit ratio of warfarin vs. NOAC at length.  The following changes have been made:  Start Xarelto Labs/ tests ordered today include: CBC and BMET    Disposition:   FU with me in 1 year   Signed, Sueanne Margarita, MD  05/02/2014 9:48 AM    [facility] [address], [place], [place]  [address] Phone: [phone]; Fax: [phone]

## 2014-05-12 ENCOUNTER — Other Ambulatory Visit: Payer: Self-pay

## 2014-05-12 ENCOUNTER — Other Ambulatory Visit: Payer: Self-pay | Admitting: Nurse Practitioner

## 2014-05-12 MED ORDER — AMLODIPINE BESYLATE 5 MG PO TABS: ORAL_TABLET | ORAL | Status: AC

## 2014-05-12 MED ORDER — AMLODIPINE BESYLATE 5 MG PO TABS: ORAL_TABLET | ORAL | Status: DC

## 2014-06-09 ENCOUNTER — Other Ambulatory Visit: Payer: Self-pay | Admitting: Internal Medicine

## 2014-06-09 ENCOUNTER — Telehealth: Payer: Self-pay

## 2014-06-09 ENCOUNTER — Ambulatory Visit
Admission: RE | Admit: 2014-06-09 | Discharge: 2014-06-09 | Disposition: A | Discharge status: Home / Self Care | Payer: Medicare Other | Source: Ambulatory Visit | Attending: Internal Medicine | Admitting: Internal Medicine

## 2014-06-09 DIAGNOSIS — Z79899 Other long term (current) drug therapy: Secondary | ICD-10-CM | POA: Diagnosis not present

## 2014-06-09 DIAGNOSIS — M11262 Other chondrocalcinosis, left knee: Secondary | ICD-10-CM | POA: Diagnosis not present

## 2014-06-09 DIAGNOSIS — M25461 Effusion, right knee: Secondary | ICD-10-CM | POA: Diagnosis not present

## 2014-06-09 DIAGNOSIS — M25562 Pain in left knee: Principal | ICD-10-CM

## 2014-06-09 DIAGNOSIS — M25561 Pain in right knee: Secondary | ICD-10-CM

## 2014-06-09 DIAGNOSIS — B351 Tinea unguium: Secondary | ICD-10-CM | POA: Diagnosis not present

## 2014-06-09 DIAGNOSIS — M170 Bilateral primary osteoarthritis of knee: Secondary | ICD-10-CM | POA: Diagnosis not present

## 2014-06-09 DIAGNOSIS — M25569 Pain in unspecified knee: Secondary | ICD-10-CM | POA: Diagnosis not present

## 2014-06-09 NOTE — Telephone Encounter (Signed)
LVM for the patient to call the practice to confirm date of flu vaccination or to call the office for an apt to take a flu shot. 

## 2014-07-24 DIAGNOSIS — Z79899 Other long term (current) drug therapy: Secondary | ICD-10-CM | POA: Diagnosis not present

## 2014-07-24 DIAGNOSIS — B351 Tinea unguium: Secondary | ICD-10-CM | POA: Diagnosis not present

## 2014-07-24 DIAGNOSIS — M11269 Other chondrocalcinosis, unspecified knee: Secondary | ICD-10-CM | POA: Diagnosis not present

## 2014-09-22 ENCOUNTER — Other Ambulatory Visit: Payer: Self-pay

## 2014-10-06 ENCOUNTER — Other Ambulatory Visit: Payer: Self-pay | Admitting: Physician Assistant

## 2014-10-08 DIAGNOSIS — R262 Difficulty in walking, not elsewhere classified: Secondary | ICD-10-CM | POA: Diagnosis not present

## 2014-10-08 DIAGNOSIS — M170 Bilateral primary osteoarthritis of knee: Secondary | ICD-10-CM | POA: Diagnosis not present

## 2014-10-20 DIAGNOSIS — M25561 Pain in right knee: Secondary | ICD-10-CM | POA: Diagnosis not present

## 2014-10-20 DIAGNOSIS — M25562 Pain in left knee: Secondary | ICD-10-CM | POA: Diagnosis not present

## 2014-10-20 DIAGNOSIS — M170 Bilateral primary osteoarthritis of knee: Secondary | ICD-10-CM | POA: Diagnosis not present

## 2014-10-30 DIAGNOSIS — M25562 Pain in left knee: Secondary | ICD-10-CM | POA: Diagnosis not present

## 2014-10-30 DIAGNOSIS — M1712 Unilateral primary osteoarthritis, left knee: Secondary | ICD-10-CM | POA: Diagnosis not present

## 2014-10-30 DIAGNOSIS — M25561 Pain in right knee: Secondary | ICD-10-CM | POA: Diagnosis not present

## 2014-10-30 DIAGNOSIS — M170 Bilateral primary osteoarthritis of knee: Secondary | ICD-10-CM | POA: Diagnosis not present

## 2014-11-04 DIAGNOSIS — M25561 Pain in right knee: Secondary | ICD-10-CM | POA: Diagnosis not present

## 2014-11-04 DIAGNOSIS — M1711 Unilateral primary osteoarthritis, right knee: Secondary | ICD-10-CM | POA: Diagnosis not present

## 2014-11-05 DIAGNOSIS — M25561 Pain in right knee: Secondary | ICD-10-CM | POA: Diagnosis not present

## 2014-11-05 DIAGNOSIS — M25562 Pain in left knee: Secondary | ICD-10-CM | POA: Diagnosis not present

## 2014-11-05 DIAGNOSIS — M1712 Unilateral primary osteoarthritis, left knee: Secondary | ICD-10-CM | POA: Diagnosis not present

## 2014-11-05 DIAGNOSIS — M170 Bilateral primary osteoarthritis of knee: Secondary | ICD-10-CM | POA: Diagnosis not present

## 2014-11-17 DIAGNOSIS — M1711 Unilateral primary osteoarthritis, right knee: Secondary | ICD-10-CM | POA: Diagnosis not present

## 2014-11-17 DIAGNOSIS — M25561 Pain in right knee: Secondary | ICD-10-CM | POA: Diagnosis not present

## 2014-11-20 DIAGNOSIS — M25562 Pain in left knee: Secondary | ICD-10-CM | POA: Diagnosis not present

## 2014-11-20 DIAGNOSIS — M170 Bilateral primary osteoarthritis of knee: Secondary | ICD-10-CM | POA: Diagnosis not present

## 2014-11-20 DIAGNOSIS — M25571 Pain in right ankle and joints of right foot: Secondary | ICD-10-CM | POA: Diagnosis not present

## 2014-11-20 DIAGNOSIS — M25572 Pain in left ankle and joints of left foot: Secondary | ICD-10-CM | POA: Diagnosis not present

## 2014-11-20 DIAGNOSIS — M1712 Unilateral primary osteoarthritis, left knee: Secondary | ICD-10-CM | POA: Diagnosis not present

## 2014-11-25 DIAGNOSIS — M170 Bilateral primary osteoarthritis of knee: Secondary | ICD-10-CM | POA: Diagnosis not present

## 2014-11-25 DIAGNOSIS — M25562 Pain in left knee: Secondary | ICD-10-CM | POA: Diagnosis not present

## 2014-11-25 DIAGNOSIS — M1711 Unilateral primary osteoarthritis, right knee: Secondary | ICD-10-CM | POA: Diagnosis not present

## 2014-11-25 DIAGNOSIS — M25561 Pain in right knee: Secondary | ICD-10-CM | POA: Diagnosis not present

## 2014-11-27 DIAGNOSIS — M1712 Unilateral primary osteoarthritis, left knee: Secondary | ICD-10-CM | POA: Diagnosis not present

## 2014-11-27 DIAGNOSIS — M25562 Pain in left knee: Secondary | ICD-10-CM | POA: Diagnosis not present

## 2014-12-04 DIAGNOSIS — M25561 Pain in right knee: Secondary | ICD-10-CM | POA: Diagnosis not present

## 2014-12-04 DIAGNOSIS — M1711 Unilateral primary osteoarthritis, right knee: Secondary | ICD-10-CM | POA: Diagnosis not present

## 2014-12-04 DIAGNOSIS — M25562 Pain in left knee: Secondary | ICD-10-CM | POA: Diagnosis not present

## 2014-12-04 DIAGNOSIS — M170 Bilateral primary osteoarthritis of knee: Secondary | ICD-10-CM | POA: Diagnosis not present

## 2015-03-08 ENCOUNTER — Emergency Department (HOSPITAL_BASED_OUTPATIENT_CLINIC_OR_DEPARTMENT_OTHER)
Admission: EM | Admit: 2015-03-08 | Discharge: 2015-03-09 | Disposition: A | Discharge status: Home / Self Care | Payer: Medicare Other | Attending: Emergency Medicine | Admitting: Emergency Medicine

## 2015-03-08 ENCOUNTER — Encounter (HOSPITAL_BASED_OUTPATIENT_CLINIC_OR_DEPARTMENT_OTHER): Payer: Self-pay | Admitting: *Deleted

## 2015-03-08 DIAGNOSIS — Z7901 Long term (current) use of anticoagulants: Secondary | ICD-10-CM | POA: Insufficient documentation

## 2015-03-08 DIAGNOSIS — Z79899 Other long term (current) drug therapy: Secondary | ICD-10-CM | POA: Insufficient documentation

## 2015-03-08 DIAGNOSIS — K8020 Calculus of gallbladder without cholecystitis without obstruction: Secondary | ICD-10-CM | POA: Insufficient documentation

## 2015-03-08 DIAGNOSIS — R1013 Epigastric pain: Secondary | ICD-10-CM | POA: Diagnosis not present

## 2015-03-08 DIAGNOSIS — Z87891 Personal history of nicotine dependence: Secondary | ICD-10-CM | POA: Insufficient documentation

## 2015-03-08 DIAGNOSIS — R7989 Other specified abnormal findings of blood chemistry: Secondary | ICD-10-CM | POA: Diagnosis not present

## 2015-03-08 DIAGNOSIS — I4891 Unspecified atrial fibrillation: Secondary | ICD-10-CM | POA: Diagnosis not present

## 2015-03-08 DIAGNOSIS — Z7982 Long term (current) use of aspirin: Secondary | ICD-10-CM | POA: Diagnosis not present

## 2015-03-08 DIAGNOSIS — Z9981 Dependence on supplemental oxygen: Secondary | ICD-10-CM | POA: Insufficient documentation

## 2015-03-08 DIAGNOSIS — R748 Abnormal levels of other serum enzymes: Secondary | ICD-10-CM

## 2015-03-08 DIAGNOSIS — R945 Abnormal results of liver function studies: Secondary | ICD-10-CM | POA: Diagnosis not present

## 2015-03-08 DIAGNOSIS — I10 Essential (primary) hypertension: Secondary | ICD-10-CM | POA: Diagnosis not present

## 2015-03-08 DIAGNOSIS — G4730 Sleep apnea, unspecified: Secondary | ICD-10-CM | POA: Insufficient documentation

## 2015-03-08 DIAGNOSIS — R109 Unspecified abdominal pain: Secondary | ICD-10-CM

## 2015-03-08 DIAGNOSIS — Z8639 Personal history of other endocrine, nutritional and metabolic disease: Secondary | ICD-10-CM | POA: Insufficient documentation

## 2015-03-08 DIAGNOSIS — R1010 Upper abdominal pain, unspecified: Secondary | ICD-10-CM | POA: Diagnosis not present

## 2015-03-08 LAB — COMPREHENSIVE METABOLIC PANEL
ALT: 94 U/L — AB (ref 17–63)
AST: 211 U/L — AB (ref 15–41)
Albumin: 3.8 g/dL (ref 3.5–5.0)
Alkaline Phosphatase: 63 U/L (ref 38–126)
Anion gap: 6 (ref 5–15)
BUN: 17 mg/dL (ref 6–20)
CHLORIDE: 110 mmol/L (ref 101–111)
CO2: 25 mmol/L (ref 22–32)
CREATININE: 0.71 mg/dL (ref 0.61–1.24)
Calcium: 8.8 mg/dL — ABNORMAL LOW (ref 8.9–10.3)
GFR calc Af Amer: >60 mL/min (ref >60)
GFR calc non Af Amer: >60 mL/min (ref >60)
Glucose, Bld: 146 mg/dL — ABNORMAL HIGH (ref 65–99)
POTASSIUM: 3.5 mmol/L (ref 3.5–5.1)
SODIUM: 141 mmol/L (ref 135–145)
Total Bilirubin: 0.8 mg/dL (ref 0.3–1.2)
Total Protein: 6.1 g/dL — ABNORMAL LOW (ref 6.5–8.1)

## 2015-03-08 LAB — CBC WITH DIFFERENTIAL/PLATELET
BASOS ABS: 0.1 10*3/uL (ref 0.0–0.1)
BASOS PCT: 1 %
EOS ABS: 0.3 10*3/uL (ref 0.0–0.7)
EOS PCT: 3 %
HCT: 43.2 % (ref 39.0–52.0)
Hemoglobin: 14.3 g/dL (ref 13.0–17.0)
LYMPHS PCT: 19 %
Lymphs Abs: 1.4 10*3/uL (ref 0.7–4.0)
MCH: 29.5 pg (ref 26.0–34.0)
MCHC: 33.1 g/dL (ref 30.0–36.0)
MCV: 89.3 fL (ref 78.0–100.0)
Monocytes Absolute: 0.5 10*3/uL (ref 0.1–1.0)
Monocytes Relative: 6 %
NEUTROS PCT: 71 %
Neutro Abs: 5.1 10*3/uL (ref 1.7–7.7)
PLATELETS: 226 10*3/uL (ref 150–400)
RBC: 4.84 MIL/uL (ref 4.22–5.81)
RDW: 12.9 % (ref 11.5–15.5)
WBC: 7.3 10*3/uL (ref 4.0–10.5)

## 2015-03-08 LAB — TROPONIN I

## 2015-03-08 LAB — LIPASE, BLOOD: Lipase: 24 U/L (ref 11–51)

## 2015-03-08 NOTE — ED Notes (Signed)
Pt states approx 30 min he started having constant mid abd pain and felt nauseated. Denies vomiting. Denies any recent illness. Denies any c/p or sob. States pain to abd has eased a little.

## 2015-03-08 NOTE — ED Provider Notes (Addendum)
CSN: [id number]     Arrival date & time 03/08/15  2233 History   By signing my name below, I, Arthur Escobar, attest that this documentation has been prepared under the direction and in the presence of Arthur Rosser, MD. Electronically Signed: Hilda Escobar, ED Scribe. 03/08/2015. 11:02 PM.     Chief Complaint  Patient presents with  . Abdominal Pain      Patient is a 64 y.o. male presenting with abdominal pain. The history is provided by the patient. No language interpreter was used.  Abdominal Pain  HPI Comments: Arthur Escobar is a 64 y.o. male with a history of atrial fibrillation status post ablation as well as PVCs. He presents to the Emergency Department complaining of a sharp epigastric abdominal pain with associated diaphoresis and nausea that has been present since 30 min prior to arrival. Pt reports he was lying in his bed watching television when he felt a sudden, sharp, severe pain below his rib cage, and began to sweat. His wife states that he "turned as white as a sheet" when his pain began as well. Pt states that pain worsens with movement and palpation of his abdomen, but is improving. Pt denies a hx of similar issues. Pt also reports taking his usual nightly 325 mg Aspirin a few hours before his symptoms began. Pt denies chest pain, SOB.    Past Medical History  Diagnosis Date  . Hypertension   . Atrial fibrillation ([place])     since 2007  . Dyslipidemia   . Sleep apnea     intolerant to CPAP  . PVC's (premature ventricular contractions)    Past Surgical History  Procedure Laterality Date  . Tonsillectomy and adenoidectomy    . Flexible sigmoidoscopy      negative; [place]  . Atrial ablation surgery     Family History  Problem Relation Age of Onset  . Heart attack Father 42    CBAG 6 vessel  . CAD Father   . Hypertension Father   . Stroke Neg Hx   . Diabetes Neg Hx   . Cancer Neg Hx   . Colon cancer Neg Hx   . Esophageal cancer Neg Hx   . Stomach cancer Neg Hx    . Rectal cancer Neg Hx   . CVA Mother    Social History  Substance Use Topics  . Smoking status: Former Smoker    Quit date: 03/28/1988  . Smokeless tobacco: Never Used     Comment: Smoked [phone] , up to 1 ppd  . Alcohol Use: No    Review of Systems    A complete 10 system review of systems was obtained and all systems are negative except as noted in the HPI and PMH.    Allergies  Review of patient's allergies indicates no known allergies.  Home Medications   Prior to Admission medications   Medication Sig Start Date End Date Taking? Authorizing Provider  aspirin 325 MG tablet Take 325 mg by mouth daily.   Yes Historical Provider, MD  aspirin 500 MG tablet Take 500 mg by mouth every 6 (six) hours as needed for pain.   Yes Historical Provider, MD  amLODipine (NORVASC) 5 MG tablet TAKE 1 TABLET  BY MOUTH DAILY. 05/12/14   Burtis Junes, NP  flecainide (TAMBOCOR) 100 MG tablet TAKE ONE TABLET BY MOUTH TWICE DAILY 10/06/14   Sueanne Margarita, MD  Multiple Vitamins-Minerals (CENTRUM SILVER PO) Take by mouth daily.    Historical Provider,  MD  rivaroxaban (XARELTO) 20 MG TABS tablet Take 1 tablet (20 mg total) by mouth daily with supper. 05/02/14   Sueanne Margarita, MD   BP 120/58 mmHg  Pulse 58  Temp(Src) 97.9 F (36.6 C) (Oral)  Resp 20  SpO2 94% Physical Exam General: Well-developed, well-nourished male in no acute distress; appearance consistent with age of record HENT: normocephalic; atraumatic Eyes: pupils equal, round and reactive to light; extraocular muscles intact Neck: supple Heart: regular rate and rhythm; frequent ectopy (PVCs on monitor) Lungs: clear to auscultation bilaterally Abdomen: soft; nondistended; upper abdominal tenderness, R>L; no masses or hepatosplenomegaly; bowel sounds present; a few gallstones seen on bedside ultrasound Extremities: No deformity; full range of motion; pulses normal Neurologic: Awake, alert and oriented; motor function intact in  all extremities and symmetric; no facial droop Skin: Warm and dry Psychiatric: Normal mood and affect   ED Course  Procedures   DIAGNOSTIC STUDIES: Oxygen Saturation is 99% on room air, normal by my interpretation.    COORDINATION OF CARE: 10:52 PM Discussed treatment plan with pt at bedside and pt agreed to plan.    MDM   Nursing notes and vitals signs, including pulse oximetry, reviewed.  Summary of this visit's results, reviewed by myself:  Labs:  Results for orders placed or performed during the hospital encounter of 03/08/15 (from the past 24 hour(s))  CBC with Differential/Platelet     Status: None   Collection Time: 03/08/15 11:18 PM  Result Value Ref Range   WBC 7.3 4.0 - 10.5 K/uL   RBC 4.84 4.22 - 5.81 MIL/uL   Hemoglobin 14.3 13.0 - 17.0 g/dL   HCT 43.2 39.0 - 52.0 %   MCV 89.3 78.0 - 100.0 fL   MCH 29.5 26.0 - 34.0 pg   MCHC 33.1 30.0 - 36.0 g/dL   RDW 12.9 11.5 - 15.5 %   Platelets 226 150 - 400 K/uL   Neutrophils Relative % 71 %   Neutro Abs 5.1 1.7 - 7.7 K/uL   Lymphocytes Relative 19 %   Lymphs Abs 1.4 0.7 - 4.0 K/uL   Monocytes Relative 6 %   Monocytes Absolute 0.5 0.1 - 1.0 K/uL   Eosinophils Relative 3 %   Eosinophils Absolute 0.3 0.0 - 0.7 K/uL   Basophils Relative 1 %   Basophils Absolute 0.1 0.0 - 0.1 K/uL  Comprehensive metabolic panel     Status: Abnormal   Collection Time: 03/08/15 11:18 PM  Result Value Ref Range   Sodium 141 135 - 145 mmol/L   Potassium 3.5 3.5 - 5.1 mmol/L   Chloride 110 101 - 111 mmol/L   CO2 25 22 - 32 mmol/L   Glucose, Bld 146 (H) 65 - 99 mg/dL   BUN 17 6 - 20 mg/dL   Creatinine, Ser 0.71 0.61 - 1.24 mg/dL   Calcium 8.8 (L) 8.9 - 10.3 mg/dL   Total Protein 6.1 (L) 6.5 - 8.1 g/dL   Albumin 3.8 3.5 - 5.0 g/dL   AST 211 (H) 15 - 41 U/L   ALT 94 (H) 17 - 63 U/L   Alkaline Phosphatase 63 38 - 126 U/L   Total Bilirubin 0.8 0.3 - 1.2 mg/dL   GFR calc non Af Amer >60 >60 mL/min   GFR calc Af Amer >60 >60 mL/min    Anion gap 6 5 - 15  Lipase, blood     Status: None   Collection Time: 03/08/15 11:18 PM  Result Value Ref Range  Lipase 24 11 - 51 U/L  Troponin I     Status: None   Collection Time: 03/08/15 11:18 PM  Result Value Ref Range   Troponin I <0.03 <0.031 ng/mL  Troponin I     Status: None   Collection Time: 03/09/15  2:18 AM  Result Value Ref Range   Troponin I <0.03 <0.031 ng/mL   2:53 AM Patient pain-free. Suspect biliary colic. We'll have her return later today for formal ultrasound.  Final diagnoses:  Abdominal pain in male   I personally performed the services described in this documentation, which was scribed in my presence. The recorded information has been reviewed and is accurate.    Arthur Rosser, MD 03/09/15 [id number]  Arthur Rosser, MD 03/09/15 [phone]

## 2015-03-09 ENCOUNTER — Ambulatory Visit (HOSPITAL_BASED_OUTPATIENT_CLINIC_OR_DEPARTMENT_OTHER)
Admission: RE | Admit: 2015-03-09 | Discharge: 2015-03-09 | Disposition: A | Discharge status: Home / Self Care | Payer: Medicare Other | Source: Ambulatory Visit | Attending: Emergency Medicine | Admitting: Emergency Medicine

## 2015-03-09 DIAGNOSIS — R1010 Upper abdominal pain, unspecified: Secondary | ICD-10-CM | POA: Insufficient documentation

## 2015-03-09 DIAGNOSIS — K8020 Calculus of gallbladder without cholecystitis without obstruction: Secondary | ICD-10-CM

## 2015-03-09 DIAGNOSIS — R110 Nausea: Secondary | ICD-10-CM

## 2015-03-09 LAB — TROPONIN I

## 2015-03-09 NOTE — ED Provider Notes (Signed)
12:45 PM Formal ultrasound demonstrates evidence of cholelithiasis.  Clinically the patient feels much better this time.  Is having no pain, nausea, or vomiting.  He had mild elevation in his liver function tests in the ER last night.  I recommended they follow-up with his doctor the next 5-7 days for repeat liver function test to make sure they are improving.  Clinically he seems to be doing much better.  I think this is symptomatic gallstones.  He may have had a common bile duct stone last night given his elevated liver function tests.  Given his improvement in symptoms I doubt he has ongoing choledocholithiasis.  Doubt cholecystitis.  He'll be given general surgery number for follow-up.  He understands to return to the ER for new or worsening symptoms  Jola Schmidt, MD 03/09/15 1246

## 2015-03-09 NOTE — Discharge Instructions (Signed)
Please call Dr Inda Merlin for recheck of your liver function tests in 5-7days. Please call the general surgery office for follow up. Please return to the ER for new or worsening symptoms    Biliary Colic Biliary colic is a pain in the upper abdomen. The pain:  Is usually felt on the right side of the abdomen, but it may also be felt in the center of the abdomen, just below the breastbone (sternum).  May spread back toward the right shoulder blade.  May be steady or irregular.  May be accompanied by nausea and vomiting. Most of the time, the pain goes away in 1-5 hours. After the most intense pain passes, the abdomen may continue to ache mildly for about 24 hours. Biliary colic is caused by a blockage in the bile duct. The bile duct is a pathway that carries bile--a liquid that helps to digest fats--from the gallbladder to the small intestine. Biliary colic usually occurs after eating, when the digestive system demands bile. The pain develops when muscle cells contract forcefully to try to move the blockage so that bile can get by. HOME CARE INSTRUCTIONS  Take medicines only as directed by your health care provider.  Drink enough fluid to keep your urine clear or pale yellow.  Avoid fatty, greasy, and fried foods. These kinds of foods increase your body's demand for bile.  Avoid any foods that make your pain worse.  Avoid overeating.  Avoid having a large meal after fasting. SEEK MEDICAL CARE IF:  You develop a fever.  Your pain gets worse.  You vomit.  You develop nausea that prevents you from eating and drinking. SEEK IMMEDIATE MEDICAL CARE IF:  You suddenly develop a fever and shaking chills.  You develop a yellowish discoloration (jaundice) of:  Skin.  Whites of the eyes.  Mucous membranes.  You have continuous or severe pain that is not relieved with medicines.  You have nausea and vomiting that is not relieved with medicines.  You develop dizziness or you  faint.   This information is not intended to replace advice given to you by your health care provider. Make sure you discuss any questions you have with your health care provider.   Document Released: 08/15/2005 Document Revised: 07/29/2014 Document Reviewed: 12/24/2013 Elsevier Interactive Patient Education [employer].

## 2015-03-17 DIAGNOSIS — M47812 Spondylosis without myelopathy or radiculopathy, cervical region: Secondary | ICD-10-CM | POA: Diagnosis not present

## 2015-03-17 DIAGNOSIS — M4316 Spondylolisthesis, lumbar region: Secondary | ICD-10-CM | POA: Diagnosis not present

## 2015-03-17 DIAGNOSIS — M47816 Spondylosis without myelopathy or radiculopathy, lumbar region: Secondary | ICD-10-CM | POA: Diagnosis not present

## 2015-04-02 DIAGNOSIS — M25561 Pain in right knee: Secondary | ICD-10-CM | POA: Diagnosis not present

## 2015-04-02 DIAGNOSIS — M170 Bilateral primary osteoarthritis of knee: Secondary | ICD-10-CM | POA: Diagnosis not present

## 2015-04-02 DIAGNOSIS — M25562 Pain in left knee: Secondary | ICD-10-CM | POA: Diagnosis not present

## 2015-04-08 DIAGNOSIS — Z23 Encounter for immunization: Secondary | ICD-10-CM | POA: Diagnosis not present

## 2015-04-13 DIAGNOSIS — M542 Cervicalgia: Secondary | ICD-10-CM | POA: Diagnosis not present

## 2015-04-13 DIAGNOSIS — M9903 Segmental and somatic dysfunction of lumbar region: Secondary | ICD-10-CM | POA: Diagnosis not present

## 2015-04-13 DIAGNOSIS — M545 Low back pain: Secondary | ICD-10-CM | POA: Diagnosis not present

## 2015-04-13 DIAGNOSIS — M9901 Segmental and somatic dysfunction of cervical region: Secondary | ICD-10-CM | POA: Diagnosis not present

## 2015-04-14 DIAGNOSIS — M542 Cervicalgia: Secondary | ICD-10-CM | POA: Diagnosis not present

## 2015-04-14 DIAGNOSIS — M9901 Segmental and somatic dysfunction of cervical region: Secondary | ICD-10-CM | POA: Diagnosis not present

## 2015-04-14 DIAGNOSIS — M9903 Segmental and somatic dysfunction of lumbar region: Secondary | ICD-10-CM | POA: Diagnosis not present

## 2015-04-14 DIAGNOSIS — M545 Low back pain: Secondary | ICD-10-CM | POA: Diagnosis not present

## 2015-04-16 DIAGNOSIS — M542 Cervicalgia: Secondary | ICD-10-CM | POA: Diagnosis not present

## 2015-04-16 DIAGNOSIS — M9903 Segmental and somatic dysfunction of lumbar region: Secondary | ICD-10-CM | POA: Diagnosis not present

## 2015-04-16 DIAGNOSIS — M9901 Segmental and somatic dysfunction of cervical region: Secondary | ICD-10-CM | POA: Diagnosis not present

## 2015-04-16 DIAGNOSIS — M545 Low back pain: Secondary | ICD-10-CM | POA: Diagnosis not present

## 2015-04-21 DIAGNOSIS — M9901 Segmental and somatic dysfunction of cervical region: Secondary | ICD-10-CM | POA: Diagnosis not present

## 2015-04-21 DIAGNOSIS — M9903 Segmental and somatic dysfunction of lumbar region: Secondary | ICD-10-CM | POA: Diagnosis not present

## 2015-04-21 DIAGNOSIS — M545 Low back pain: Secondary | ICD-10-CM | POA: Diagnosis not present

## 2015-04-21 DIAGNOSIS — M542 Cervicalgia: Secondary | ICD-10-CM | POA: Diagnosis not present

## 2015-04-24 DIAGNOSIS — M545 Low back pain: Secondary | ICD-10-CM | POA: Diagnosis not present

## 2015-04-24 DIAGNOSIS — M9903 Segmental and somatic dysfunction of lumbar region: Secondary | ICD-10-CM | POA: Diagnosis not present

## 2015-04-24 DIAGNOSIS — M542 Cervicalgia: Secondary | ICD-10-CM | POA: Diagnosis not present

## 2015-04-24 DIAGNOSIS — M9901 Segmental and somatic dysfunction of cervical region: Secondary | ICD-10-CM | POA: Diagnosis not present

## 2015-04-30 DIAGNOSIS — M545 Low back pain: Secondary | ICD-10-CM | POA: Diagnosis not present

## 2015-04-30 DIAGNOSIS — M9901 Segmental and somatic dysfunction of cervical region: Secondary | ICD-10-CM | POA: Diagnosis not present

## 2015-04-30 DIAGNOSIS — M9903 Segmental and somatic dysfunction of lumbar region: Secondary | ICD-10-CM | POA: Diagnosis not present

## 2015-04-30 DIAGNOSIS — M542 Cervicalgia: Secondary | ICD-10-CM | POA: Diagnosis not present

## 2015-05-05 DIAGNOSIS — M542 Cervicalgia: Secondary | ICD-10-CM | POA: Diagnosis not present

## 2015-05-05 DIAGNOSIS — M9901 Segmental and somatic dysfunction of cervical region: Secondary | ICD-10-CM | POA: Diagnosis not present

## 2015-05-05 DIAGNOSIS — M545 Low back pain: Secondary | ICD-10-CM | POA: Diagnosis not present

## 2015-05-05 DIAGNOSIS — M9903 Segmental and somatic dysfunction of lumbar region: Secondary | ICD-10-CM | POA: Diagnosis not present

## 2015-05-06 ENCOUNTER — Other Ambulatory Visit: Payer: Self-pay | Admitting: Cardiology

## 2015-05-07 DIAGNOSIS — M542 Cervicalgia: Secondary | ICD-10-CM | POA: Diagnosis not present

## 2015-05-07 DIAGNOSIS — M9903 Segmental and somatic dysfunction of lumbar region: Secondary | ICD-10-CM | POA: Diagnosis not present

## 2015-05-07 DIAGNOSIS — M545 Low back pain: Secondary | ICD-10-CM | POA: Diagnosis not present

## 2015-05-07 DIAGNOSIS — M9901 Segmental and somatic dysfunction of cervical region: Secondary | ICD-10-CM | POA: Diagnosis not present

## 2015-05-12 DIAGNOSIS — M9903 Segmental and somatic dysfunction of lumbar region: Secondary | ICD-10-CM | POA: Diagnosis not present

## 2015-05-12 DIAGNOSIS — M9901 Segmental and somatic dysfunction of cervical region: Secondary | ICD-10-CM | POA: Diagnosis not present

## 2015-05-12 DIAGNOSIS — M542 Cervicalgia: Secondary | ICD-10-CM | POA: Diagnosis not present

## 2015-05-12 DIAGNOSIS — M545 Low back pain: Secondary | ICD-10-CM | POA: Diagnosis not present

## 2015-05-14 DIAGNOSIS — M9901 Segmental and somatic dysfunction of cervical region: Secondary | ICD-10-CM | POA: Diagnosis not present

## 2015-05-14 DIAGNOSIS — M545 Low back pain: Secondary | ICD-10-CM | POA: Diagnosis not present

## 2015-05-14 DIAGNOSIS — M9903 Segmental and somatic dysfunction of lumbar region: Secondary | ICD-10-CM | POA: Diagnosis not present

## 2015-05-14 DIAGNOSIS — M542 Cervicalgia: Secondary | ICD-10-CM | POA: Diagnosis not present

## 2015-05-18 DIAGNOSIS — M9901 Segmental and somatic dysfunction of cervical region: Secondary | ICD-10-CM | POA: Diagnosis not present

## 2015-05-18 DIAGNOSIS — M542 Cervicalgia: Secondary | ICD-10-CM | POA: Diagnosis not present

## 2015-05-18 DIAGNOSIS — M9903 Segmental and somatic dysfunction of lumbar region: Secondary | ICD-10-CM | POA: Diagnosis not present

## 2015-05-18 DIAGNOSIS — M545 Low back pain: Secondary | ICD-10-CM | POA: Diagnosis not present

## 2015-06-08 ENCOUNTER — Other Ambulatory Visit: Payer: Self-pay | Admitting: Cardiology

## 2015-06-08 DIAGNOSIS — M545 Low back pain: Secondary | ICD-10-CM | POA: Diagnosis not present

## 2015-06-08 DIAGNOSIS — M542 Cervicalgia: Secondary | ICD-10-CM | POA: Diagnosis not present

## 2015-06-08 DIAGNOSIS — M9903 Segmental and somatic dysfunction of lumbar region: Secondary | ICD-10-CM | POA: Diagnosis not present

## 2015-06-08 DIAGNOSIS — M9901 Segmental and somatic dysfunction of cervical region: Secondary | ICD-10-CM | POA: Diagnosis not present

## 2015-06-08 NOTE — Telephone Encounter (Signed)
Pt called & needs refills for Flecainide and Amlodipine sent to [facility] on [address]. I informed pt that he is overdue for appt. Transferred pt to scheduling to schedule one year follow up appt

## 2015-06-16 DIAGNOSIS — M542 Cervicalgia: Secondary | ICD-10-CM | POA: Diagnosis not present

## 2015-06-16 DIAGNOSIS — M9901 Segmental and somatic dysfunction of cervical region: Secondary | ICD-10-CM | POA: Diagnosis not present

## 2015-06-16 DIAGNOSIS — M545 Low back pain: Secondary | ICD-10-CM | POA: Diagnosis not present

## 2015-06-16 DIAGNOSIS — M9903 Segmental and somatic dysfunction of lumbar region: Secondary | ICD-10-CM | POA: Diagnosis not present

## 2015-06-18 DIAGNOSIS — M9903 Segmental and somatic dysfunction of lumbar region: Secondary | ICD-10-CM | POA: Diagnosis not present

## 2015-06-18 DIAGNOSIS — M542 Cervicalgia: Secondary | ICD-10-CM | POA: Diagnosis not present

## 2015-06-18 DIAGNOSIS — M9901 Segmental and somatic dysfunction of cervical region: Secondary | ICD-10-CM | POA: Diagnosis not present

## 2015-06-18 DIAGNOSIS — M545 Low back pain: Secondary | ICD-10-CM | POA: Diagnosis not present

## 2015-06-21 NOTE — Progress Notes (Signed)
Cardiology Office Note   Date:  06/22/2015   ID:  Arthur Escobar, DOB April 10, 1950, MRN [id number]  PCP:  Henrine Screws, MD    Chief Complaint  Patient presents with  . Atrial Fibrillation  . Hypertension      History of Present Illness: Arthur Escobar is a 65 y.o. male who has a history of atrial flutter ablation and atrial fibrillation treated with flecainide. He also has benign essential hypertension. He comes in today for yearly followup. He denies any chest pain, palpitations, dyspnea, dyspnea on exertion, dizziness, LE edema or presyncope. He is very active playing sports and has lost 25lbs in the past year.  He has some leg cramps in his quads due to back issues and strethcing helps.     Past Medical History  Diagnosis Date  . Hypertension   . Atrial fibrillation ([place])     since 2007  . Dyslipidemia   . Sleep apnea     intolerant to CPAP  . PVC's (premature ventricular contractions)     Past Surgical History  Procedure Laterality Date  . Tonsillectomy and adenoidectomy    . Flexible sigmoidoscopy      negative; [place]  . Atrial ablation surgery       Current Outpatient Prescriptions  Medication Sig Dispense Refill  . amLODipine (NORVASC) 5 MG tablet TAKE 1 TABLET  BY MOUTH DAILY. 30 tablet 11  . aspirin 325 MG tablet Take 325 mg by mouth daily.    Marland Kitchen aspirin 500 MG tablet Take 500 mg by mouth every 6 (six) hours as needed for pain.    . flecainide (TAMBOCOR) 100 MG tablet Take 1 tablet (100 mg total) by mouth 2 (two) times daily. 60 tablet 0  . Multiple Vitamins-Minerals (CENTRUM SILVER PO) Take 1 tablet by mouth daily.     . [DISCONTINUED] rivaroxaban (XARELTO) 20 MG TABS tablet Take 1 tablet (20 mg total) by mouth daily with supper. 30 tablet 11   No current facility-administered medications for this visit.    Allergies:   Review of patient's allergies indicates no known allergies.    Social History:  The patient  reports that he quit  smoking about 27 years ago. He has never used smokeless tobacco. He reports that he does not drink alcohol or use illicit drugs.   Family History:  The patient's family history includes CAD in his father; CVA in his mother; Heart attack (age of onset: 93) in his father; Hypertension in his father. There is no history of Stroke, Diabetes, Cancer, Colon cancer, Esophageal cancer, Stomach cancer, or Rectal cancer.    ROS:  Please see the history of present illness.   Otherwise, review of systems are positive for none.   All other systems are reviewed and negative.    PHYSICAL EXAM: VS:  BP 136/78 mmHg  Pulse 57  Ht 6' (1.829 m)  Wt 198 lb 12.8 oz (90.175 kg)  BMI 26.96 kg/m2 , BMI Body mass index is 26.96 kg/(m^2). GEN: Well nourished, well developed, in no acute distress HEENT: normal Neck: no JVD, carotid bruits, or masses Cardiac: RRR; no murmurs, rubs, or gallops,no edema  Respiratory:  clear to auscultation bilaterally, normal work of breathing GI: soft, nontender, nondistended, + BS MS: no deformity or atrophy Skin: warm and dry, no rash Neuro:  Strength and sensation are intact Psych: euthymic mood, full affect   EKG:  EKG is  not ordered today.    Recent Labs: 03/08/2015: ALT 94*; BUN 17; Creatinine, Ser 0.71; Hemoglobin 14.3; Platelets 226; Potassium 3.5; Sodium 141    Lipid Panel    Component Value Date/Time   CHOL 230* 08/09/2012 1451   TRIG 150.0* 08/09/2012 1451   HDL 43.80 08/09/2012 1451   CHOLHDL 5 08/09/2012 1451   VLDL 30.0 08/09/2012 1451   LDLCALC  03/06/2008 0350    71        Total Cholesterol/HDL:CHD Risk Coronary Heart Disease Risk Table                     Men   Women  1/2 Average Risk   3.4   3.3   LDLDIRECT 157.3 08/09/2012 1451      Wt Readings from Last 3 Encounters:  06/22/15 198 lb 12.8 oz (90.175 kg)  05/02/14 209 lb 12.8 oz (95.165 kg)  09/30/13 205 lb (92.987 kg)     ASSESSMENT AND PLAN:  1. Paroxysmal atrial fibrillation -  maintaining sinus bradycardia with first degree AV block with PVCs and nonspecific IVCD on Flecainide. His CHADS2VASC score is  2 (HTN and age 65) Continue Xarelto. Check NOAC panel today. 2. HTN - controlled on amlodipine 3. Dyslipidemia - followed by PCP 4. PVC's - asymptomatic   Current medicines are reviewed at length with the patient today.  The patient does not have concerns regarding medicines.  The following changes have been made:  no change  Labs/ tests ordered today: See above Assessment and Plan No orders of the defined types were placed in this encounter.     Disposition:   FU with me in 1 year  Signed, Sueanne Margarita, MD  06/22/2015 11:06 AM    [facility] [address], [place], [place]  [address] Phone: [phone]; Fax: [phone]

## 2015-06-22 ENCOUNTER — Encounter: Payer: Self-pay | Admitting: Cardiology

## 2015-06-22 ENCOUNTER — Ambulatory Visit (INDEPENDENT_AMBULATORY_CARE_PROVIDER_SITE_OTHER): Payer: Medicare Other | Admitting: Cardiology

## 2015-06-22 VITALS — BP 136/78 | HR 57 | Ht 72.0 in | Wt 198.8 lb

## 2015-06-22 DIAGNOSIS — I10 Essential (primary) hypertension: Secondary | ICD-10-CM

## 2015-06-22 DIAGNOSIS — I493 Ventricular premature depolarization: Secondary | ICD-10-CM | POA: Diagnosis not present

## 2015-06-22 DIAGNOSIS — I480 Paroxysmal atrial fibrillation: Secondary | ICD-10-CM

## 2015-06-22 NOTE — Patient Instructions (Signed)

## 2015-06-23 DIAGNOSIS — M542 Cervicalgia: Secondary | ICD-10-CM | POA: Diagnosis not present

## 2015-06-23 DIAGNOSIS — M9903 Segmental and somatic dysfunction of lumbar region: Secondary | ICD-10-CM | POA: Diagnosis not present

## 2015-06-23 DIAGNOSIS — M545 Low back pain: Secondary | ICD-10-CM | POA: Diagnosis not present

## 2015-06-23 DIAGNOSIS — M9901 Segmental and somatic dysfunction of cervical region: Secondary | ICD-10-CM | POA: Diagnosis not present

## 2015-06-25 DIAGNOSIS — K8020 Calculus of gallbladder without cholecystitis without obstruction: Secondary | ICD-10-CM | POA: Diagnosis not present

## 2015-06-25 DIAGNOSIS — Z79899 Other long term (current) drug therapy: Secondary | ICD-10-CM | POA: Diagnosis not present

## 2015-06-25 DIAGNOSIS — Z0001 Encounter for general adult medical examination with abnormal findings: Secondary | ICD-10-CM | POA: Diagnosis not present

## 2015-06-25 DIAGNOSIS — M11269 Other chondrocalcinosis, unspecified knee: Secondary | ICD-10-CM | POA: Diagnosis not present

## 2015-06-25 DIAGNOSIS — E782 Mixed hyperlipidemia: Secondary | ICD-10-CM | POA: Diagnosis not present

## 2015-06-25 DIAGNOSIS — G4730 Sleep apnea, unspecified: Secondary | ICD-10-CM | POA: Diagnosis not present

## 2015-06-25 DIAGNOSIS — I495 Sick sinus syndrome: Secondary | ICD-10-CM | POA: Diagnosis not present

## 2015-06-25 DIAGNOSIS — Z125 Encounter for screening for malignant neoplasm of prostate: Secondary | ICD-10-CM | POA: Diagnosis not present

## 2015-06-25 DIAGNOSIS — I4892 Unspecified atrial flutter: Secondary | ICD-10-CM | POA: Diagnosis not present

## 2015-06-25 DIAGNOSIS — B351 Tinea unguium: Secondary | ICD-10-CM | POA: Diagnosis not present

## 2015-06-25 DIAGNOSIS — G4700 Insomnia, unspecified: Secondary | ICD-10-CM | POA: Diagnosis not present

## 2015-06-25 DIAGNOSIS — I10 Essential (primary) hypertension: Secondary | ICD-10-CM | POA: Diagnosis not present

## 2015-06-25 DIAGNOSIS — I4891 Unspecified atrial fibrillation: Secondary | ICD-10-CM | POA: Diagnosis not present

## 2015-06-25 DIAGNOSIS — E559 Vitamin D deficiency, unspecified: Secondary | ICD-10-CM | POA: Diagnosis not present

## 2015-07-02 DIAGNOSIS — M9903 Segmental and somatic dysfunction of lumbar region: Secondary | ICD-10-CM | POA: Diagnosis not present

## 2015-07-02 DIAGNOSIS — M545 Low back pain: Secondary | ICD-10-CM | POA: Diagnosis not present

## 2015-07-02 DIAGNOSIS — M9901 Segmental and somatic dysfunction of cervical region: Secondary | ICD-10-CM | POA: Diagnosis not present

## 2015-07-02 DIAGNOSIS — M542 Cervicalgia: Secondary | ICD-10-CM | POA: Diagnosis not present

## 2015-07-06 DIAGNOSIS — M25562 Pain in left knee: Secondary | ICD-10-CM | POA: Diagnosis not present

## 2015-07-06 DIAGNOSIS — R262 Difficulty in walking, not elsewhere classified: Secondary | ICD-10-CM | POA: Diagnosis not present

## 2015-07-06 DIAGNOSIS — M170 Bilateral primary osteoarthritis of knee: Secondary | ICD-10-CM | POA: Diagnosis not present

## 2015-07-06 DIAGNOSIS — M25561 Pain in right knee: Secondary | ICD-10-CM | POA: Diagnosis not present

## 2015-07-07 DIAGNOSIS — M545 Low back pain: Secondary | ICD-10-CM | POA: Diagnosis not present

## 2015-07-07 DIAGNOSIS — M542 Cervicalgia: Secondary | ICD-10-CM | POA: Diagnosis not present

## 2015-07-07 DIAGNOSIS — M9901 Segmental and somatic dysfunction of cervical region: Secondary | ICD-10-CM | POA: Diagnosis not present

## 2015-07-07 DIAGNOSIS — M9903 Segmental and somatic dysfunction of lumbar region: Secondary | ICD-10-CM | POA: Diagnosis not present

## 2015-07-08 ENCOUNTER — Other Ambulatory Visit: Payer: Self-pay | Admitting: Cardiology

## 2015-07-14 DIAGNOSIS — M9903 Segmental and somatic dysfunction of lumbar region: Secondary | ICD-10-CM | POA: Diagnosis not present

## 2015-07-14 DIAGNOSIS — M542 Cervicalgia: Secondary | ICD-10-CM | POA: Diagnosis not present

## 2015-07-14 DIAGNOSIS — M9901 Segmental and somatic dysfunction of cervical region: Secondary | ICD-10-CM | POA: Diagnosis not present

## 2015-07-14 DIAGNOSIS — M545 Low back pain: Secondary | ICD-10-CM | POA: Diagnosis not present

## 2015-07-15 DIAGNOSIS — M1712 Unilateral primary osteoarthritis, left knee: Secondary | ICD-10-CM | POA: Diagnosis not present

## 2015-07-15 DIAGNOSIS — M25562 Pain in left knee: Secondary | ICD-10-CM | POA: Diagnosis not present

## 2015-07-15 DIAGNOSIS — R2689 Other abnormalities of gait and mobility: Secondary | ICD-10-CM | POA: Diagnosis not present

## 2015-07-28 DIAGNOSIS — M542 Cervicalgia: Secondary | ICD-10-CM | POA: Diagnosis not present

## 2015-07-28 DIAGNOSIS — M9901 Segmental and somatic dysfunction of cervical region: Secondary | ICD-10-CM | POA: Diagnosis not present

## 2015-07-28 DIAGNOSIS — M545 Low back pain: Secondary | ICD-10-CM | POA: Diagnosis not present

## 2015-07-28 DIAGNOSIS — M9903 Segmental and somatic dysfunction of lumbar region: Secondary | ICD-10-CM | POA: Diagnosis not present

## 2015-10-26 ENCOUNTER — Other Ambulatory Visit: Payer: Self-pay | Admitting: Internal Medicine

## 2015-10-26 ENCOUNTER — Ambulatory Visit
Admission: RE | Admit: 2015-10-26 | Discharge: 2015-10-26 | Disposition: A | Discharge status: Home / Self Care | Payer: Medicare Other | Source: Ambulatory Visit | Attending: Internal Medicine | Admitting: Internal Medicine

## 2015-10-26 DIAGNOSIS — M1611 Unilateral primary osteoarthritis, right hip: Secondary | ICD-10-CM | POA: Diagnosis not present

## 2015-10-26 DIAGNOSIS — R1031 Right lower quadrant pain: Secondary | ICD-10-CM

## 2015-11-04 DIAGNOSIS — H2513 Age-related nuclear cataract, bilateral: Secondary | ICD-10-CM | POA: Diagnosis not present

## 2015-11-04 DIAGNOSIS — H43813 Vitreous degeneration, bilateral: Secondary | ICD-10-CM | POA: Diagnosis not present

## 2015-11-04 DIAGNOSIS — H52223 Regular astigmatism, bilateral: Secondary | ICD-10-CM | POA: Diagnosis not present

## 2015-11-04 DIAGNOSIS — H43812 Vitreous degeneration, left eye: Secondary | ICD-10-CM | POA: Diagnosis not present

## 2015-11-04 DIAGNOSIS — H524 Presbyopia: Secondary | ICD-10-CM | POA: Diagnosis not present

## 2015-11-04 DIAGNOSIS — H5211 Myopia, right eye: Secondary | ICD-10-CM | POA: Diagnosis not present

## 2015-11-04 DIAGNOSIS — H43811 Vitreous degeneration, right eye: Secondary | ICD-10-CM | POA: Diagnosis not present

## 2016-01-10 DIAGNOSIS — Z23 Encounter for immunization: Secondary | ICD-10-CM | POA: Diagnosis not present

## 2016-02-01 DIAGNOSIS — M25562 Pain in left knee: Secondary | ICD-10-CM | POA: Diagnosis not present

## 2016-02-01 DIAGNOSIS — M25561 Pain in right knee: Secondary | ICD-10-CM | POA: Diagnosis not present

## 2016-02-01 DIAGNOSIS — M170 Bilateral primary osteoarthritis of knee: Secondary | ICD-10-CM | POA: Diagnosis not present

## 2016-02-10 DIAGNOSIS — M1711 Unilateral primary osteoarthritis, right knee: Secondary | ICD-10-CM | POA: Diagnosis not present

## 2016-02-10 DIAGNOSIS — M25561 Pain in right knee: Secondary | ICD-10-CM | POA: Diagnosis not present

## 2016-02-11 DIAGNOSIS — M1712 Unilateral primary osteoarthritis, left knee: Secondary | ICD-10-CM | POA: Diagnosis not present

## 2016-02-11 DIAGNOSIS — M25562 Pain in left knee: Secondary | ICD-10-CM | POA: Diagnosis not present

## 2016-02-15 DIAGNOSIS — M25561 Pain in right knee: Secondary | ICD-10-CM | POA: Diagnosis not present

## 2016-02-15 DIAGNOSIS — M1711 Unilateral primary osteoarthritis, right knee: Secondary | ICD-10-CM | POA: Diagnosis not present

## 2016-02-16 DIAGNOSIS — M25562 Pain in left knee: Secondary | ICD-10-CM | POA: Diagnosis not present

## 2016-02-16 DIAGNOSIS — M1712 Unilateral primary osteoarthritis, left knee: Secondary | ICD-10-CM | POA: Diagnosis not present

## 2016-02-23 DIAGNOSIS — M25561 Pain in right knee: Secondary | ICD-10-CM | POA: Diagnosis not present

## 2016-02-23 DIAGNOSIS — M1711 Unilateral primary osteoarthritis, right knee: Secondary | ICD-10-CM | POA: Diagnosis not present

## 2016-02-24 DIAGNOSIS — M25562 Pain in left knee: Secondary | ICD-10-CM | POA: Diagnosis not present

## 2016-02-24 DIAGNOSIS — M1712 Unilateral primary osteoarthritis, left knee: Secondary | ICD-10-CM | POA: Diagnosis not present

## 2016-03-02 DIAGNOSIS — M25561 Pain in right knee: Secondary | ICD-10-CM | POA: Diagnosis not present

## 2016-03-02 DIAGNOSIS — M25562 Pain in left knee: Secondary | ICD-10-CM | POA: Diagnosis not present

## 2016-03-02 DIAGNOSIS — M170 Bilateral primary osteoarthritis of knee: Secondary | ICD-10-CM | POA: Diagnosis not present

## 2016-04-24 IMAGING — CR DG KNEE 1-2V*R*
2 series · 2 of 2 positions shown · non-contrast
Comparison: Left knee of today's date

CLINICAL DATA: Two months of atraumatic anterior sharp pain
involving the knees bilaterally.

EXAM:
RIGHT KNEE - 1-2 VIEW

[view not recorded (1 of 2)]
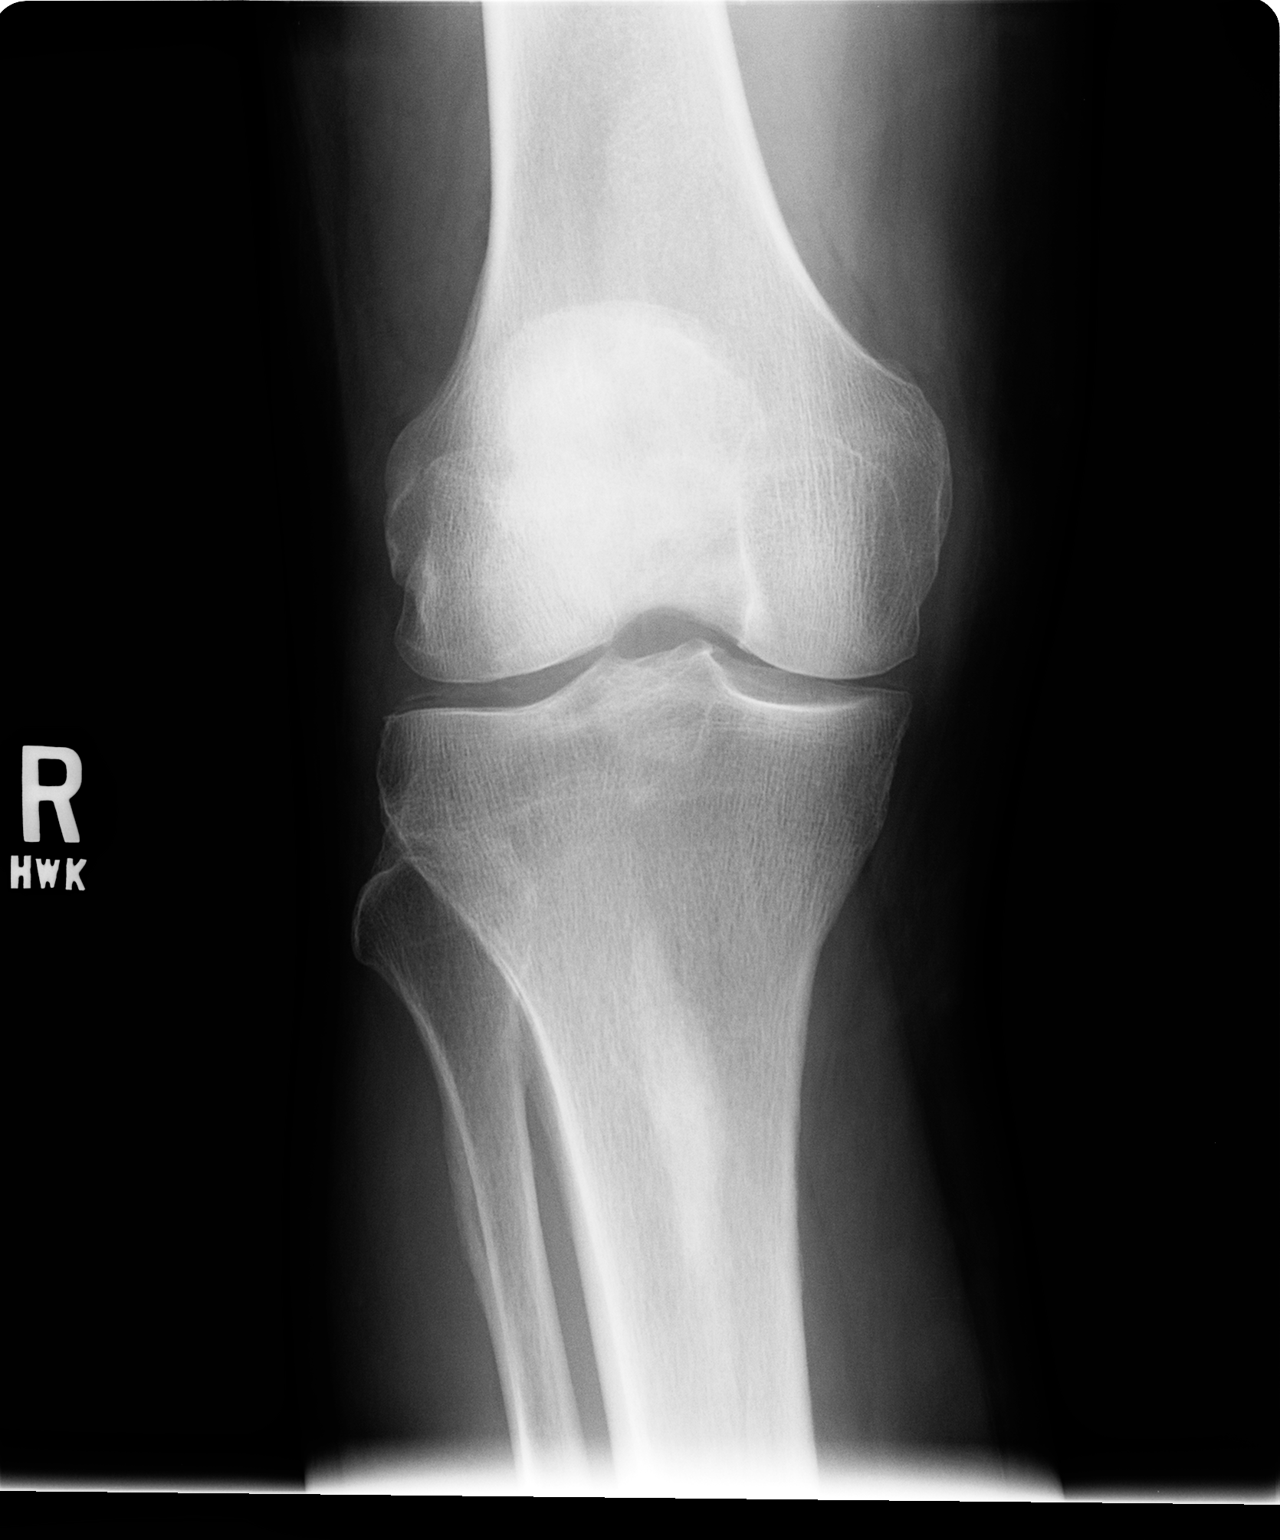

[view not recorded (2 of 2)]
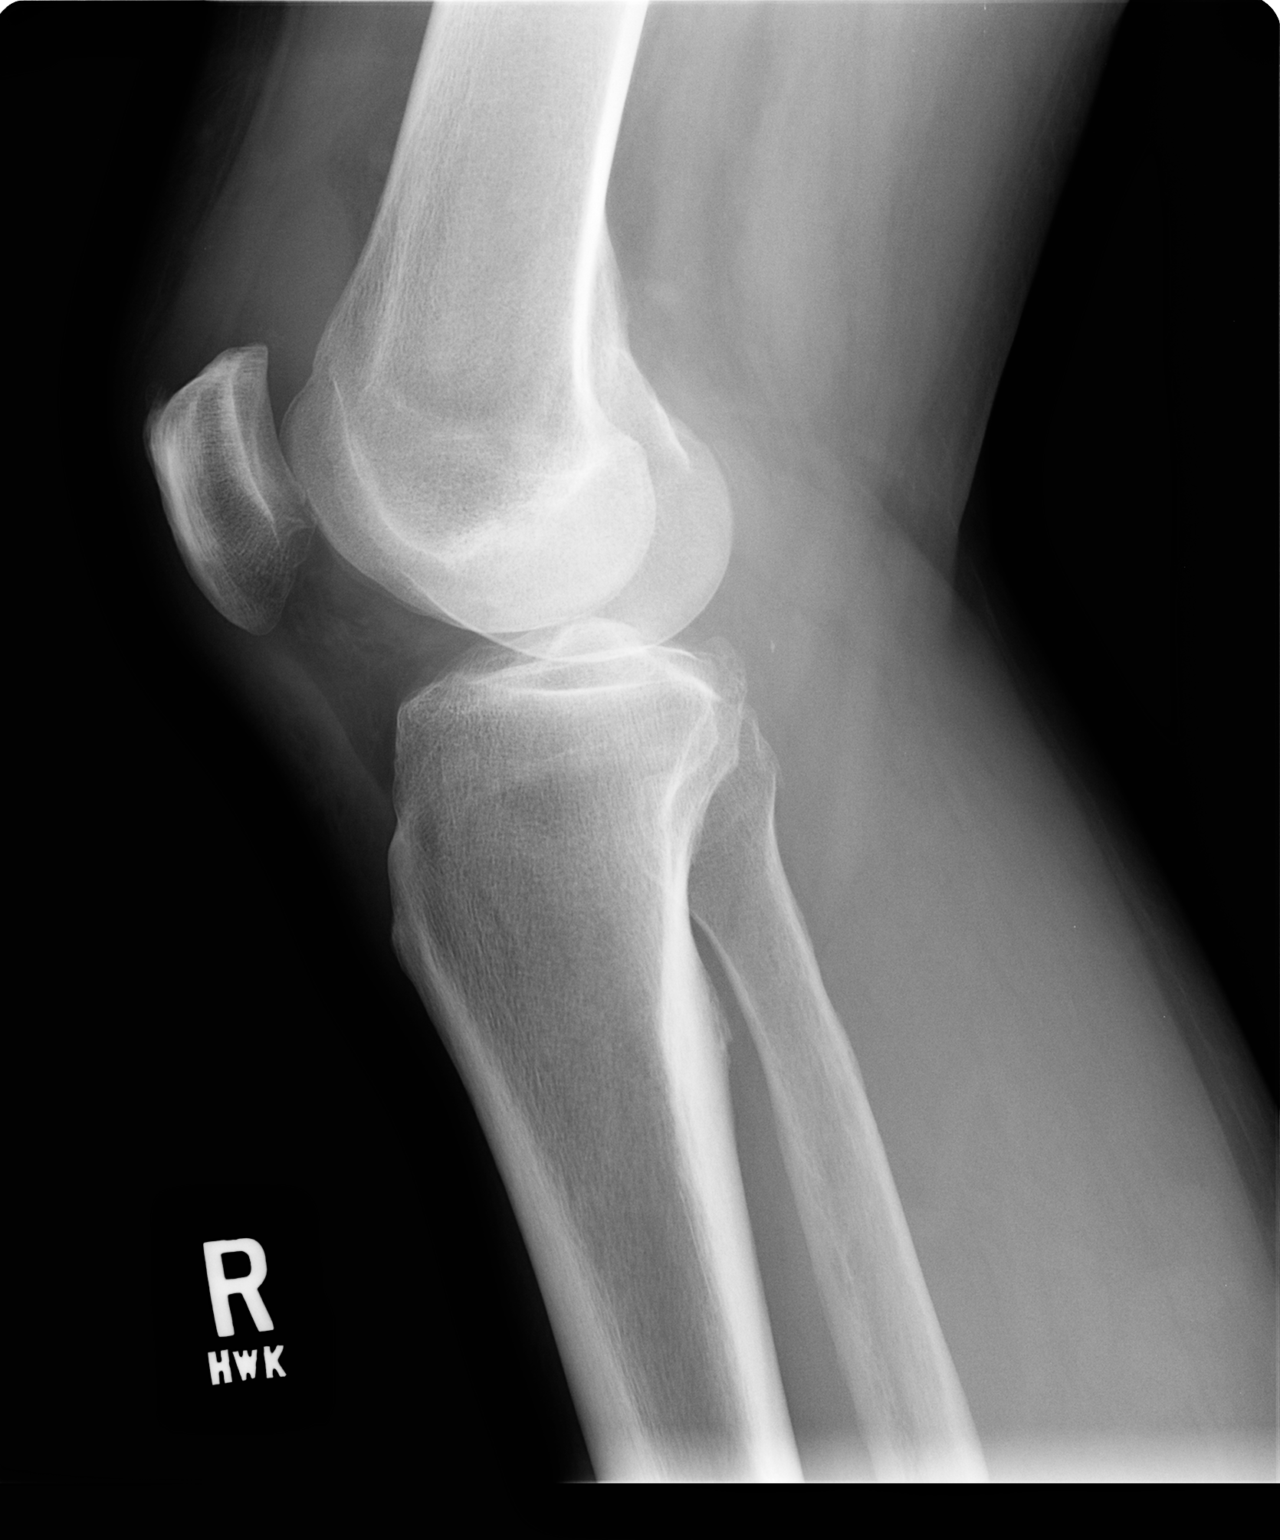

[2 of 2 positions shown; findings below may reference images not displayed]

FINDINGS: The bones of the right knee are adequately mineralized. There is
chondrocalcinosis of the menisci. There is minimal beaking of the
lateral tibial spine. There is a small suprapatellar effusion. The
patella is normally positioned. There is no acute or healing
fracture.
IMPRESSION: There are mild osteoarthritic changes of both right and left joint
compartments. There is a small suprapatellar effusion.

## 2016-05-18 DIAGNOSIS — D2339 Other benign neoplasm of skin of other parts of face: Secondary | ICD-10-CM | POA: Diagnosis not present

## 2016-05-26 DIAGNOSIS — I4891 Unspecified atrial fibrillation: Secondary | ICD-10-CM | POA: Diagnosis not present

## 2016-05-26 DIAGNOSIS — G4733 Obstructive sleep apnea (adult) (pediatric): Secondary | ICD-10-CM | POA: Diagnosis not present

## 2016-05-26 DIAGNOSIS — Z13220 Encounter for screening for lipoid disorders: Secondary | ICD-10-CM | POA: Diagnosis not present

## 2016-05-26 DIAGNOSIS — Z23 Encounter for immunization: Secondary | ICD-10-CM | POA: Diagnosis not present

## 2016-05-26 DIAGNOSIS — H9193 Unspecified hearing loss, bilateral: Secondary | ICD-10-CM | POA: Diagnosis not present

## 2016-05-26 DIAGNOSIS — I10 Essential (primary) hypertension: Secondary | ICD-10-CM | POA: Diagnosis not present

## 2016-08-09 DIAGNOSIS — H6123 Impacted cerumen, bilateral: Secondary | ICD-10-CM | POA: Diagnosis not present

## 2016-08-09 DIAGNOSIS — J342 Deviated nasal septum: Secondary | ICD-10-CM | POA: Diagnosis not present

## 2016-08-09 DIAGNOSIS — H903 Sensorineural hearing loss, bilateral: Secondary | ICD-10-CM | POA: Diagnosis not present

## 2016-08-09 DIAGNOSIS — H9313 Tinnitus, bilateral: Secondary | ICD-10-CM | POA: Diagnosis not present

## 2016-09-29 DIAGNOSIS — J40 Bronchitis, not specified as acute or chronic: Secondary | ICD-10-CM | POA: Diagnosis not present

## 2016-11-29 DIAGNOSIS — M25561 Pain in right knee: Secondary | ICD-10-CM | POA: Diagnosis not present

## 2016-11-29 DIAGNOSIS — M170 Bilateral primary osteoarthritis of knee: Secondary | ICD-10-CM | POA: Diagnosis not present

## 2016-11-29 DIAGNOSIS — M25562 Pain in left knee: Secondary | ICD-10-CM | POA: Diagnosis not present

## 2016-11-30 DIAGNOSIS — I4891 Unspecified atrial fibrillation: Secondary | ICD-10-CM | POA: Diagnosis not present

## 2016-11-30 DIAGNOSIS — I499 Cardiac arrhythmia, unspecified: Secondary | ICD-10-CM | POA: Diagnosis not present

## 2016-11-30 DIAGNOSIS — R9431 Abnormal electrocardiogram [ECG] [EKG]: Secondary | ICD-10-CM | POA: Diagnosis not present

## 2016-11-30 DIAGNOSIS — I493 Ventricular premature depolarization: Secondary | ICD-10-CM | POA: Diagnosis not present

## 2016-12-07 DIAGNOSIS — M25561 Pain in right knee: Secondary | ICD-10-CM | POA: Diagnosis not present

## 2016-12-07 DIAGNOSIS — M170 Bilateral primary osteoarthritis of knee: Secondary | ICD-10-CM | POA: Diagnosis not present

## 2016-12-07 DIAGNOSIS — M25562 Pain in left knee: Secondary | ICD-10-CM | POA: Diagnosis not present

## 2016-12-14 DIAGNOSIS — I480 Paroxysmal atrial fibrillation: Secondary | ICD-10-CM | POA: Diagnosis not present

## 2016-12-15 DIAGNOSIS — M25561 Pain in right knee: Secondary | ICD-10-CM | POA: Diagnosis not present

## 2016-12-15 DIAGNOSIS — I499 Cardiac arrhythmia, unspecified: Secondary | ICD-10-CM | POA: Diagnosis not present

## 2016-12-15 DIAGNOSIS — M170 Bilateral primary osteoarthritis of knee: Secondary | ICD-10-CM | POA: Diagnosis not present

## 2016-12-15 DIAGNOSIS — I493 Ventricular premature depolarization: Secondary | ICD-10-CM | POA: Diagnosis not present

## 2016-12-15 DIAGNOSIS — I4891 Unspecified atrial fibrillation: Secondary | ICD-10-CM | POA: Diagnosis not present

## 2016-12-15 DIAGNOSIS — M25562 Pain in left knee: Secondary | ICD-10-CM | POA: Diagnosis not present

## 2016-12-29 DIAGNOSIS — E782 Mixed hyperlipidemia: Secondary | ICD-10-CM | POA: Diagnosis not present

## 2016-12-29 DIAGNOSIS — I10 Essential (primary) hypertension: Secondary | ICD-10-CM | POA: Diagnosis not present

## 2016-12-29 DIAGNOSIS — I4891 Unspecified atrial fibrillation: Secondary | ICD-10-CM | POA: Diagnosis not present

## 2017-01-17 DIAGNOSIS — R9431 Abnormal electrocardiogram [ECG] [EKG]: Secondary | ICD-10-CM | POA: Diagnosis not present

## 2017-01-17 DIAGNOSIS — I498 Other specified cardiac arrhythmias: Secondary | ICD-10-CM | POA: Diagnosis not present

## 2017-01-17 DIAGNOSIS — Z8679 Personal history of other diseases of the circulatory system: Secondary | ICD-10-CM | POA: Diagnosis not present

## 2017-01-17 DIAGNOSIS — I480 Paroxysmal atrial fibrillation: Secondary | ICD-10-CM | POA: Diagnosis not present

## 2017-01-17 DIAGNOSIS — I493 Ventricular premature depolarization: Secondary | ICD-10-CM | POA: Diagnosis not present

## 2017-01-17 DIAGNOSIS — I452 Bifascicular block: Secondary | ICD-10-CM | POA: Diagnosis not present

## 2017-01-17 DIAGNOSIS — I495 Sick sinus syndrome: Secondary | ICD-10-CM | POA: Diagnosis not present

## 2017-01-27 DIAGNOSIS — Z23 Encounter for immunization: Secondary | ICD-10-CM | POA: Diagnosis not present

## 2017-02-20 DIAGNOSIS — Z23 Encounter for immunization: Secondary | ICD-10-CM | POA: Diagnosis not present

## 2017-04-03 DIAGNOSIS — R0609 Other forms of dyspnea: Secondary | ICD-10-CM | POA: Diagnosis not present

## 2017-04-04 DIAGNOSIS — I495 Sick sinus syndrome: Secondary | ICD-10-CM | POA: Diagnosis not present

## 2017-04-04 DIAGNOSIS — Z8679 Personal history of other diseases of the circulatory system: Secondary | ICD-10-CM | POA: Diagnosis not present

## 2017-04-04 DIAGNOSIS — R9431 Abnormal electrocardiogram [ECG] [EKG]: Secondary | ICD-10-CM | POA: Diagnosis not present

## 2017-04-04 DIAGNOSIS — I480 Paroxysmal atrial fibrillation: Secondary | ICD-10-CM | POA: Diagnosis not present

## 2017-04-04 DIAGNOSIS — R0609 Other forms of dyspnea: Secondary | ICD-10-CM | POA: Diagnosis not present

## 2017-04-04 DIAGNOSIS — I493 Ventricular premature depolarization: Secondary | ICD-10-CM | POA: Diagnosis not present

## 2017-04-04 DIAGNOSIS — I452 Bifascicular block: Secondary | ICD-10-CM | POA: Diagnosis not present

## 2017-04-04 DIAGNOSIS — I498 Other specified cardiac arrhythmias: Secondary | ICD-10-CM | POA: Diagnosis not present

## 2017-04-07 DIAGNOSIS — I495 Sick sinus syndrome: Secondary | ICD-10-CM | POA: Diagnosis not present

## 2017-04-07 DIAGNOSIS — R5383 Other fatigue: Secondary | ICD-10-CM | POA: Diagnosis not present

## 2017-04-07 DIAGNOSIS — Z87891 Personal history of nicotine dependence: Secondary | ICD-10-CM | POA: Diagnosis not present

## 2017-04-07 DIAGNOSIS — E785 Hyperlipidemia, unspecified: Secondary | ICD-10-CM | POA: Diagnosis not present

## 2017-04-07 DIAGNOSIS — Z79899 Other long term (current) drug therapy: Secondary | ICD-10-CM | POA: Diagnosis not present

## 2017-04-07 DIAGNOSIS — Z7982 Long term (current) use of aspirin: Secondary | ICD-10-CM | POA: Diagnosis not present

## 2017-04-07 DIAGNOSIS — I480 Paroxysmal atrial fibrillation: Secondary | ICD-10-CM | POA: Diagnosis not present

## 2017-04-07 DIAGNOSIS — I452 Bifascicular block: Secondary | ICD-10-CM | POA: Diagnosis not present

## 2017-04-07 DIAGNOSIS — I4589 Other specified conduction disorders: Secondary | ICD-10-CM | POA: Diagnosis not present

## 2017-04-07 DIAGNOSIS — I493 Ventricular premature depolarization: Secondary | ICD-10-CM | POA: Diagnosis not present

## 2017-04-07 DIAGNOSIS — I10 Essential (primary) hypertension: Secondary | ICD-10-CM | POA: Diagnosis not present

## 2017-04-07 DIAGNOSIS — I2510 Atherosclerotic heart disease of native coronary artery without angina pectoris: Secondary | ICD-10-CM | POA: Diagnosis not present

## 2017-04-07 DIAGNOSIS — R9431 Abnormal electrocardiogram [ECG] [EKG]: Secondary | ICD-10-CM | POA: Diagnosis not present

## 2017-04-07 DIAGNOSIS — R0602 Shortness of breath: Secondary | ICD-10-CM | POA: Diagnosis not present

## 2017-04-10 DIAGNOSIS — I452 Bifascicular block: Secondary | ICD-10-CM | POA: Diagnosis not present

## 2017-04-10 DIAGNOSIS — R0609 Other forms of dyspnea: Secondary | ICD-10-CM | POA: Diagnosis not present

## 2017-04-10 DIAGNOSIS — I480 Paroxysmal atrial fibrillation: Secondary | ICD-10-CM | POA: Diagnosis not present

## 2017-04-10 DIAGNOSIS — I2510 Atherosclerotic heart disease of native coronary artery without angina pectoris: Secondary | ICD-10-CM | POA: Diagnosis not present

## 2017-04-10 DIAGNOSIS — R9431 Abnormal electrocardiogram [ECG] [EKG]: Secondary | ICD-10-CM | POA: Diagnosis not present

## 2017-04-10 DIAGNOSIS — I495 Sick sinus syndrome: Secondary | ICD-10-CM | POA: Diagnosis not present

## 2017-04-10 DIAGNOSIS — I493 Ventricular premature depolarization: Secondary | ICD-10-CM | POA: Diagnosis not present

## 2017-04-24 DIAGNOSIS — H35352 Cystoid macular degeneration, left eye: Secondary | ICD-10-CM | POA: Diagnosis not present

## 2017-04-24 DIAGNOSIS — H2513 Age-related nuclear cataract, bilateral: Secondary | ICD-10-CM | POA: Diagnosis not present

## 2017-05-10 DIAGNOSIS — I10 Essential (primary) hypertension: Secondary | ICD-10-CM | POA: Diagnosis present

## 2017-05-10 DIAGNOSIS — I491 Atrial premature depolarization: Secondary | ICD-10-CM | POA: Diagnosis not present

## 2017-05-10 DIAGNOSIS — Z950 Presence of cardiac pacemaker: Secondary | ICD-10-CM | POA: Diagnosis not present

## 2017-05-10 DIAGNOSIS — E785 Hyperlipidemia, unspecified: Secondary | ICD-10-CM | POA: Diagnosis present

## 2017-05-10 DIAGNOSIS — R5382 Chronic fatigue, unspecified: Secondary | ICD-10-CM | POA: Diagnosis present

## 2017-05-10 DIAGNOSIS — Z87891 Personal history of nicotine dependence: Secondary | ICD-10-CM | POA: Diagnosis not present

## 2017-05-10 DIAGNOSIS — I452 Bifascicular block: Secondary | ICD-10-CM | POA: Diagnosis present

## 2017-05-10 DIAGNOSIS — I495 Sick sinus syndrome: Secondary | ICD-10-CM | POA: Diagnosis present

## 2017-05-11 DIAGNOSIS — I491 Atrial premature depolarization: Secondary | ICD-10-CM | POA: Diagnosis not present

## 2017-05-24 DIAGNOSIS — M25562 Pain in left knee: Secondary | ICD-10-CM | POA: Diagnosis not present

## 2017-05-24 DIAGNOSIS — M170 Bilateral primary osteoarthritis of knee: Secondary | ICD-10-CM | POA: Diagnosis not present

## 2017-05-24 DIAGNOSIS — M25561 Pain in right knee: Secondary | ICD-10-CM | POA: Diagnosis not present

## 2017-05-25 DIAGNOSIS — R05 Cough: Secondary | ICD-10-CM | POA: Diagnosis not present

## 2017-06-02 DIAGNOSIS — M1712 Unilateral primary osteoarthritis, left knee: Secondary | ICD-10-CM | POA: Diagnosis not present

## 2017-07-13 DIAGNOSIS — M1712 Unilateral primary osteoarthritis, left knee: Secondary | ICD-10-CM | POA: Diagnosis not present

## 2017-07-28 ENCOUNTER — Encounter: Payer: Self-pay | Admitting: *Deleted

## 2017-08-18 ENCOUNTER — Encounter: Payer: Self-pay | Admitting: Internal Medicine

## 2017-09-10 IMAGING — CR DG HIP (WITH OR WITHOUT PELVIS) 2-3V*R*
2 series · 2 of 2 positions shown · non-contrast
Comparison: None.

CLINICAL DATA: Right anterior hip pain extending to the knee for 2
months. Initial encounter.

EXAM:
DG HIP (WITH OR WITHOUT PELVIS) 2-3V RIGHT

[w pelvis *]
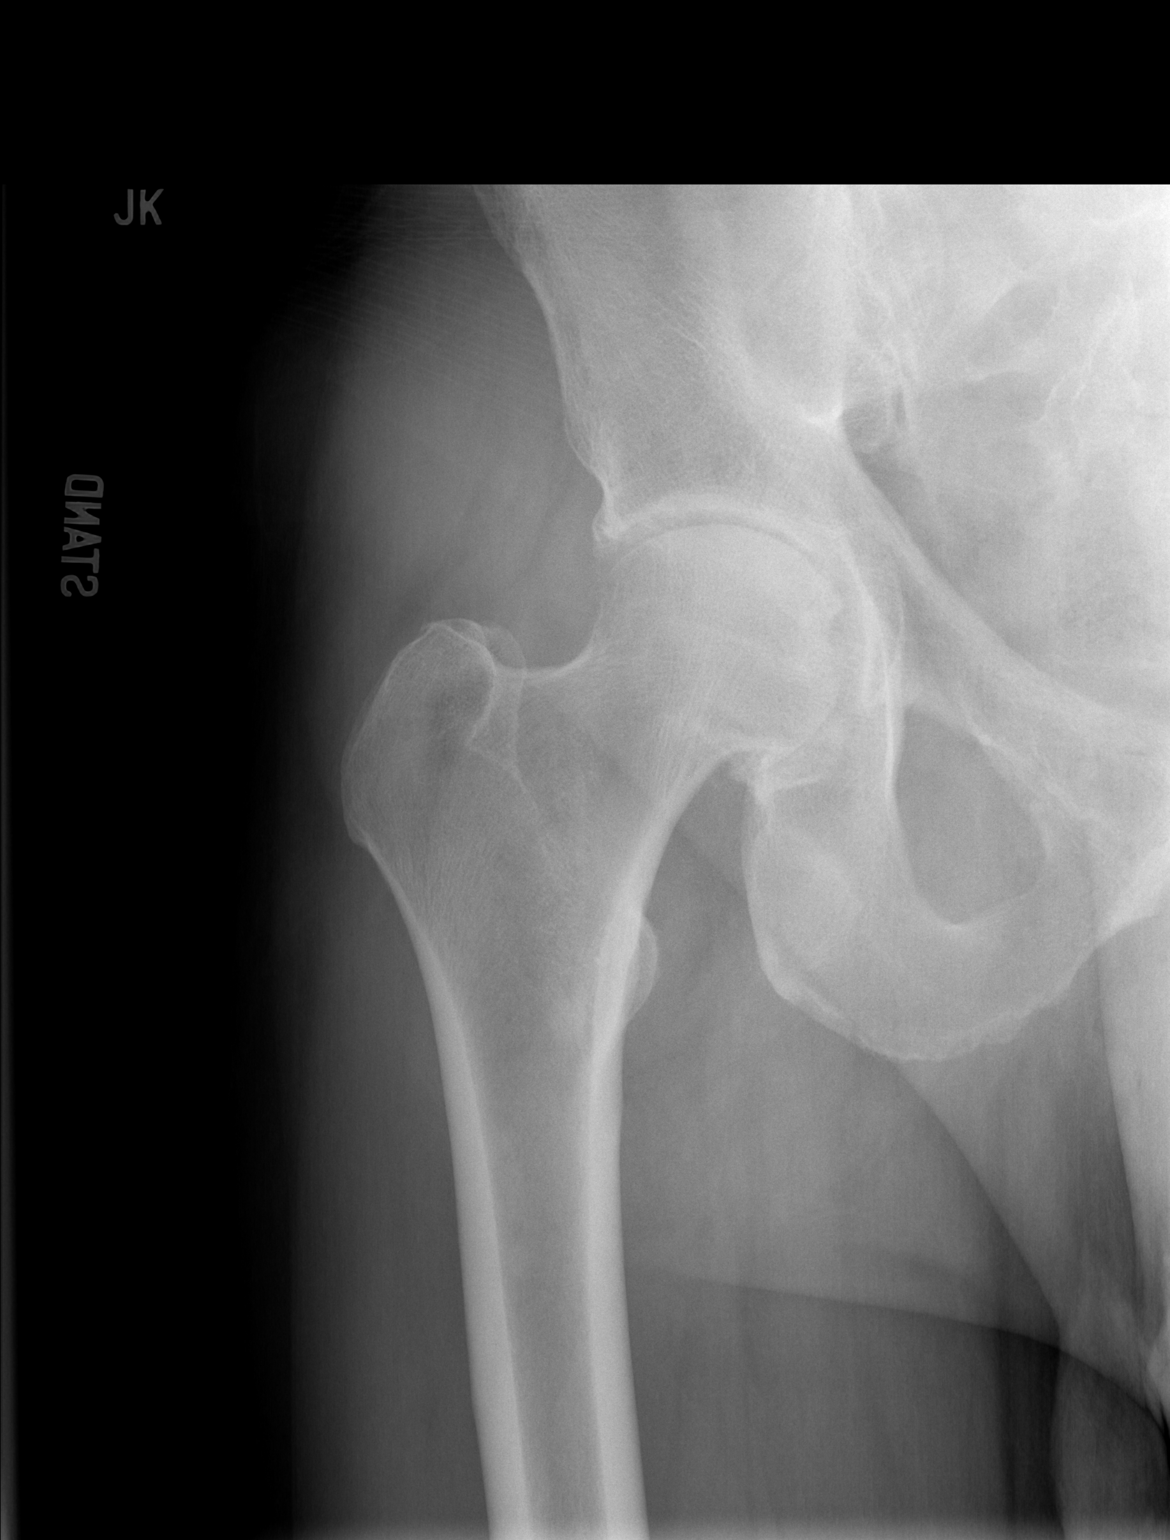

[t hip frog leg right]
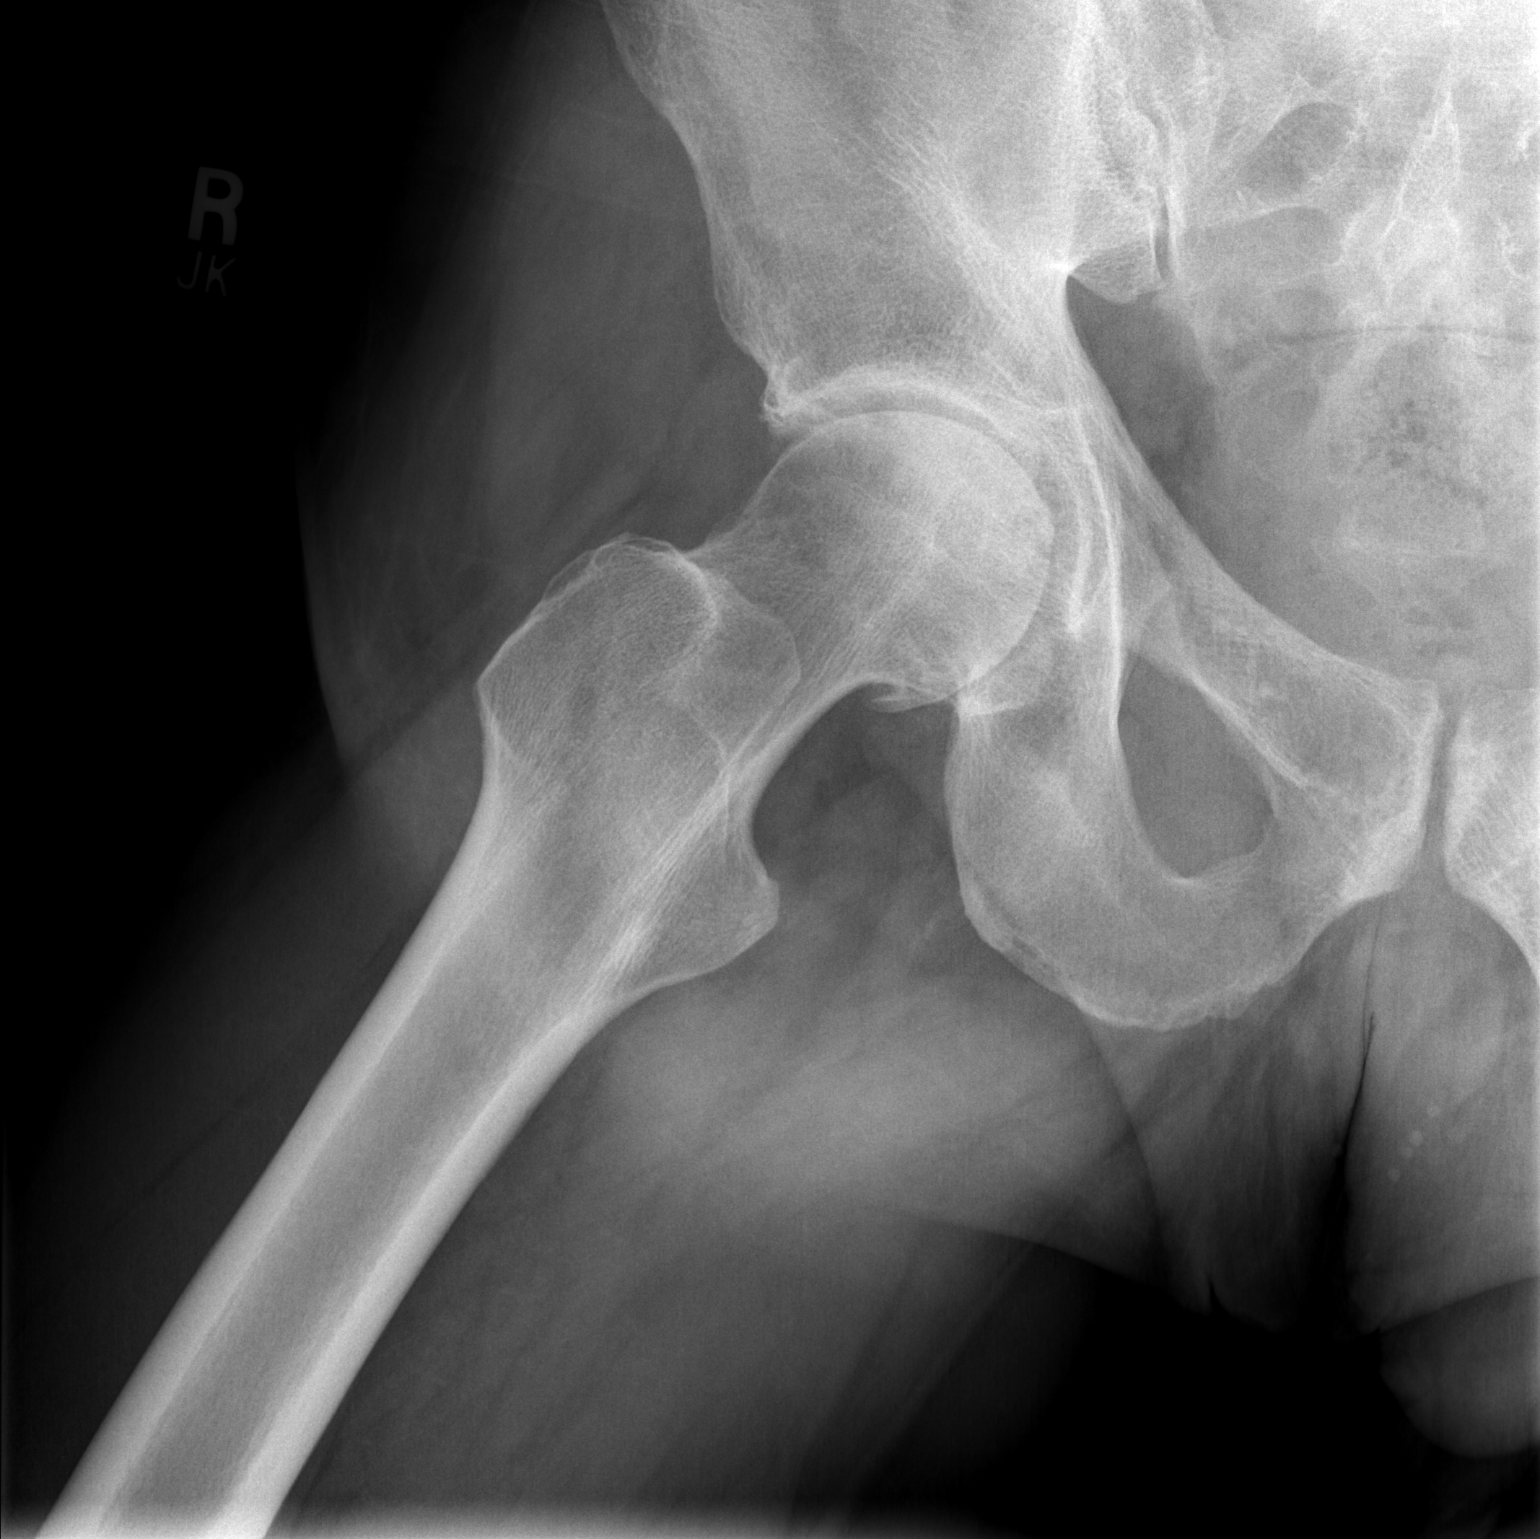

[2 of 2 positions shown; findings below may reference images not displayed]

FINDINGS: Degenerative changes are noted in the right hip. There is loss of
joint space and sclerosis. Osteophytes are noted at the femoral
head. No acute fracture is present. The hip is located. The
visualized hemi pelvis is clear.
IMPRESSION: 1. Moderate degenerative changes of the right hip.
2. No acute abnormality.
